# Patient Record
Sex: Female | Born: 1935 | Race: White | Hispanic: No | Smoking: Former smoker
Health system: Southern US, Community
[De-identification: ages and names within clinical notes are randomized; demographics above are authoritative.]

## PROBLEM LIST (undated history)

## (undated) DIAGNOSIS — M8580 Other specified disorders of bone density and structure, unspecified site: Secondary | ICD-10-CM

## (undated) DIAGNOSIS — Z972 Presence of dental prosthetic device (complete) (partial): Secondary | ICD-10-CM

## (undated) DIAGNOSIS — E278 Other specified disorders of adrenal gland: Secondary | ICD-10-CM

## (undated) DIAGNOSIS — M1990 Unspecified osteoarthritis, unspecified site: Secondary | ICD-10-CM

## (undated) DIAGNOSIS — R42 Dizziness and giddiness: Secondary | ICD-10-CM

## (undated) DIAGNOSIS — I10 Essential (primary) hypertension: Secondary | ICD-10-CM

## (undated) DIAGNOSIS — G5600 Carpal tunnel syndrome, unspecified upper limb: Secondary | ICD-10-CM

## (undated) DIAGNOSIS — E785 Hyperlipidemia, unspecified: Secondary | ICD-10-CM

## (undated) DIAGNOSIS — J449 Chronic obstructive pulmonary disease, unspecified: Secondary | ICD-10-CM

## (undated) DIAGNOSIS — I82409 Acute embolism and thrombosis of unspecified deep veins of unspecified lower extremity: Secondary | ICD-10-CM

## (undated) DIAGNOSIS — E669 Obesity, unspecified: Secondary | ICD-10-CM

## (undated) DIAGNOSIS — T148XXA Other injury of unspecified body region, initial encounter: Secondary | ICD-10-CM

## (undated) DIAGNOSIS — I809 Phlebitis and thrombophlebitis of unspecified site: Secondary | ICD-10-CM

## (undated) DIAGNOSIS — E559 Vitamin D deficiency, unspecified: Secondary | ICD-10-CM

## (undated) DIAGNOSIS — K219 Gastro-esophageal reflux disease without esophagitis: Secondary | ICD-10-CM

## (undated) HISTORY — PX: TUBAL LIGATION: SHX77

## (undated) HISTORY — PX: APPENDECTOMY: SHX54

## (undated) HISTORY — PX: FOOT SURGERY: SHX648

## (undated) HISTORY — PX: OTHER SURGICAL HISTORY: SHX169

## (undated) HISTORY — PX: CHOLECYSTECTOMY: SHX55

---

## 1973-06-21 DIAGNOSIS — I82409 Acute embolism and thrombosis of unspecified deep veins of unspecified lower extremity: Secondary | ICD-10-CM

## 1973-06-21 HISTORY — DX: Acute embolism and thrombosis of unspecified deep veins of unspecified lower extremity: I82.409

## 2005-01-06 ENCOUNTER — Ambulatory Visit: Payer: Self-pay | Admitting: Podiatry

## 2005-01-26 ENCOUNTER — Ambulatory Visit: Payer: Self-pay | Admitting: Internal Medicine

## 2005-10-14 ENCOUNTER — Ambulatory Visit: Payer: Self-pay | Admitting: Internal Medicine

## 2005-11-02 ENCOUNTER — Ambulatory Visit: Payer: Self-pay | Admitting: General Surgery

## 2005-11-17 ENCOUNTER — Ambulatory Visit: Payer: Self-pay | Admitting: General Surgery

## 2006-02-03 ENCOUNTER — Ambulatory Visit: Payer: Self-pay | Admitting: Unknown Physician Specialty

## 2007-04-04 ENCOUNTER — Ambulatory Visit: Payer: Self-pay | Admitting: Unknown Physician Specialty

## 2008-05-24 ENCOUNTER — Ambulatory Visit: Payer: Self-pay | Admitting: General Surgery

## 2008-06-03 ENCOUNTER — Ambulatory Visit: Payer: Self-pay | Admitting: General Surgery

## 2008-06-11 ENCOUNTER — Ambulatory Visit: Payer: Self-pay | Admitting: Unknown Physician Specialty

## 2009-01-03 ENCOUNTER — Ambulatory Visit: Payer: Self-pay | Admitting: Internal Medicine

## 2009-06-18 ENCOUNTER — Ambulatory Visit: Payer: Self-pay | Admitting: Unknown Physician Specialty

## 2010-02-19 ENCOUNTER — Ambulatory Visit: Payer: Self-pay | Admitting: Internal Medicine

## 2010-05-18 ENCOUNTER — Ambulatory Visit: Payer: Self-pay | Admitting: Gastroenterology

## 2010-06-19 ENCOUNTER — Ambulatory Visit: Payer: Self-pay | Admitting: Unknown Physician Specialty

## 2011-03-09 ENCOUNTER — Ambulatory Visit: Payer: Self-pay | Admitting: Internal Medicine

## 2011-03-15 ENCOUNTER — Ambulatory Visit: Payer: Self-pay | Admitting: Otolaryngology

## 2011-07-15 ENCOUNTER — Ambulatory Visit: Payer: Self-pay | Admitting: Unknown Physician Specialty

## 2012-03-13 ENCOUNTER — Ambulatory Visit: Payer: Self-pay | Admitting: Internal Medicine

## 2013-03-21 ENCOUNTER — Ambulatory Visit: Payer: Self-pay | Admitting: Internal Medicine

## 2013-07-26 ENCOUNTER — Ambulatory Visit: Payer: Self-pay | Admitting: Internal Medicine

## 2013-12-24 ENCOUNTER — Ambulatory Visit: Payer: Self-pay

## 2013-12-27 ENCOUNTER — Ambulatory Visit: Payer: Self-pay

## 2014-02-19 ENCOUNTER — Ambulatory Visit: Payer: Self-pay | Admitting: Internal Medicine

## 2014-04-21 HISTORY — PX: ROTATOR CUFF REPAIR: SHX139

## 2014-08-08 ENCOUNTER — Ambulatory Visit: Payer: Self-pay | Admitting: Internal Medicine

## 2015-07-04 ENCOUNTER — Encounter: Payer: Self-pay | Admitting: *Deleted

## 2015-07-07 ENCOUNTER — Encounter: Payer: Self-pay | Admitting: *Deleted

## 2015-07-07 ENCOUNTER — Encounter
Admission: RE | Disposition: A | Discharge status: Home / Self Care | Payer: Self-pay | Source: Ambulatory Visit | Attending: Gastroenterology

## 2015-07-07 ENCOUNTER — Ambulatory Visit
Admission: RE | Admit: 2015-07-07 | Discharge: 2015-07-07 | Disposition: A | Discharge status: Home / Self Care | Payer: PPO | Source: Ambulatory Visit | Attending: Gastroenterology | Admitting: Gastroenterology

## 2015-07-07 DIAGNOSIS — Z1211 Encounter for screening for malignant neoplasm of colon: Secondary | ICD-10-CM | POA: Diagnosis not present

## 2015-07-07 DIAGNOSIS — Z86010 Personal history of colonic polyps: Secondary | ICD-10-CM | POA: Insufficient documentation

## 2015-07-07 DIAGNOSIS — K5730 Diverticulosis of large intestine without perforation or abscess without bleeding: Secondary | ICD-10-CM | POA: Insufficient documentation

## 2015-07-07 HISTORY — DX: Vitamin D deficiency, unspecified: E55.9

## 2015-07-07 HISTORY — DX: Gastro-esophageal reflux disease without esophagitis: K21.9

## 2015-07-07 HISTORY — DX: Carpal tunnel syndrome, unspecified upper limb: G56.00

## 2015-07-07 HISTORY — DX: Hyperlipidemia, unspecified: E78.5

## 2015-07-07 HISTORY — DX: Other specified disorders of bone density and structure, unspecified site: M85.80

## 2015-07-07 HISTORY — DX: Chronic obstructive pulmonary disease, unspecified: J44.9

## 2015-07-07 HISTORY — DX: Phlebitis and thrombophlebitis of unspecified site: I80.9

## 2015-07-07 HISTORY — DX: Obesity, unspecified: E66.9

## 2015-07-07 HISTORY — DX: Other specified disorders of adrenal gland: E27.8

## 2015-07-07 HISTORY — DX: Acute embolism and thrombosis of unspecified deep veins of unspecified lower extremity: I82.409

## 2015-07-07 SURGERY — COLONOSCOPY WITH PROPOFOL
Anesthesia: General

## 2015-07-07 MED ORDER — FENTANYL CITRATE (PF) 100 MCG/2ML IJ SOLN
INTRAMUSCULAR | Status: AC
  Filled 2015-07-07: qty 4

## 2015-07-07 MED ORDER — SODIUM CHLORIDE 0.9 % IV SOLN
INTRAVENOUS | Status: DC
  Administered 2015-07-07: 14:00:00 via INTRAVENOUS

## 2015-07-07 MED ORDER — MIDAZOLAM HCL 5 MG/5ML IJ SOLN
INTRAMUSCULAR | Status: AC
  Filled 2015-07-07: qty 10

## 2015-07-08 ENCOUNTER — Ambulatory Visit: Payer: PPO | Admitting: Anesthesiology

## 2015-07-08 ENCOUNTER — Encounter
Admission: RE | Disposition: A | Discharge status: Home / Self Care | Payer: Self-pay | Source: Ambulatory Visit | Attending: Gastroenterology

## 2015-07-08 ENCOUNTER — Ambulatory Visit: Admission: RE | Admit: 2015-07-08 | Payer: PPO | Source: Ambulatory Visit | Admitting: Gastroenterology

## 2015-07-08 ENCOUNTER — Encounter: Payer: Self-pay | Admitting: Anesthesiology

## 2015-07-08 ENCOUNTER — Ambulatory Visit
Admission: RE | Admit: 2015-07-08 | Discharge: 2015-07-08 | Disposition: A | Discharge status: Home / Self Care | Payer: PPO | Source: Ambulatory Visit | Attending: Gastroenterology | Admitting: Gastroenterology

## 2015-07-08 DIAGNOSIS — E559 Vitamin D deficiency, unspecified: Secondary | ICD-10-CM | POA: Diagnosis not present

## 2015-07-08 DIAGNOSIS — J449 Chronic obstructive pulmonary disease, unspecified: Secondary | ICD-10-CM | POA: Insufficient documentation

## 2015-07-08 DIAGNOSIS — Z7982 Long term (current) use of aspirin: Secondary | ICD-10-CM | POA: Diagnosis not present

## 2015-07-08 DIAGNOSIS — D126 Benign neoplasm of colon, unspecified: Secondary | ICD-10-CM | POA: Diagnosis not present

## 2015-07-08 DIAGNOSIS — Z1211 Encounter for screening for malignant neoplasm of colon: Secondary | ICD-10-CM | POA: Diagnosis not present

## 2015-07-08 DIAGNOSIS — Z79899 Other long term (current) drug therapy: Secondary | ICD-10-CM | POA: Diagnosis not present

## 2015-07-08 DIAGNOSIS — K5730 Diverticulosis of large intestine without perforation or abscess without bleeding: Secondary | ICD-10-CM | POA: Diagnosis not present

## 2015-07-08 DIAGNOSIS — K219 Gastro-esophageal reflux disease without esophagitis: Secondary | ICD-10-CM | POA: Insufficient documentation

## 2015-07-08 DIAGNOSIS — K5790 Diverticulosis of intestine, part unspecified, without perforation or abscess without bleeding: Secondary | ICD-10-CM | POA: Diagnosis not present

## 2015-07-08 DIAGNOSIS — K635 Polyp of colon: Secondary | ICD-10-CM | POA: Diagnosis not present

## 2015-07-08 DIAGNOSIS — E669 Obesity, unspecified: Secondary | ICD-10-CM | POA: Insufficient documentation

## 2015-07-08 DIAGNOSIS — E785 Hyperlipidemia, unspecified: Secondary | ICD-10-CM | POA: Diagnosis not present

## 2015-07-08 DIAGNOSIS — Z87891 Personal history of nicotine dependence: Secondary | ICD-10-CM | POA: Diagnosis not present

## 2015-07-08 DIAGNOSIS — D124 Benign neoplasm of descending colon: Secondary | ICD-10-CM | POA: Diagnosis not present

## 2015-07-08 DIAGNOSIS — Z86010 Personal history of colonic polyps: Secondary | ICD-10-CM | POA: Diagnosis not present

## 2015-07-08 HISTORY — PX: COLONOSCOPY: SHX5424

## 2015-07-08 SURGERY — COLONOSCOPY
Anesthesia: General

## 2015-07-08 SURGERY — COLONOSCOPY WITH PROPOFOL
Anesthesia: General

## 2015-07-08 MED ORDER — SODIUM CHLORIDE 0.9 % IV SOLN: INTRAVENOUS | Status: DC

## 2015-07-08 MED ORDER — PROPOFOL 10 MG/ML IV BOLUS
INTRAVENOUS | Status: DC | PRN
  Administered 2015-07-08: 200 mg via INTRAVENOUS
  Administered 2015-07-08: 50 mg via INTRAVENOUS

## 2015-07-08 NOTE — Anesthesia Preprocedure Evaluation (Signed)
Anesthesia Evaluation  Patient identified by MRN, date of birth, ID band Patient awake    Reviewed: Allergy & Precautions, H&P , NPO status , Patient's Chart, lab work & pertinent test results  History of Anesthesia Complications Negative for: history of anesthetic complications  Airway Mallampati: III  TM Distance: >3 FB Neck ROM: limited    Dental no notable dental hx. (+) Poor Dentition, Missing, Upper Dentures, Lower Dentures   Pulmonary neg shortness of breath, COPD, former smoker,    Pulmonary exam normal breath sounds clear to auscultation       Cardiovascular Exercise Tolerance: Good (-) angina(-) Past MI and (-) DOE Normal cardiovascular exam Rhythm:regular Rate:Normal     Neuro/Psych  Neuromuscular disease negative psych ROS   GI/Hepatic Neg liver ROS, GERD  Controlled,  Endo/Other  negative endocrine ROS  Renal/GU negative Renal ROS  negative genitourinary   Musculoskeletal   Abdominal   Peds  Hematology negative hematology ROS (+)   Anesthesia Other Findings Past Medical History:   Carpal tunnel syndrome                                       COPD (chronic obstructive pulmonary disease) (*              GERD (gastroesophageal reflux disease)                       DVT (deep venous thrombosis) (HCC)                           Hyperlipidemia                                               Adrenal mass (HCC)                                           Obesity                                                      Osteopenia                                                   Phlebitis                                                    Vitamin D deficiency                                        Past Surgical History:   APPENDECTOMY  TUBAL LIGATION                                                CHOLECYSTECTOMY                                               bladder  tack                                                  FOOT SURGERY                                                  ROTATOR CUFF REPAIR                             Right Nov.2015       Reproductive/Obstetrics negative OB ROS                             Anesthesia Physical Anesthesia Plan  ASA: III  Anesthesia Plan: General   Post-op Pain Management:    Induction:   Airway Management Planned:   Additional Equipment:   Intra-op Plan:   Post-operative Plan:   Informed Consent: I have reviewed the patients History and Physical, chart, labs and discussed the procedure including the risks, benefits and alternatives for the proposed anesthesia with the patient or authorized representative who has indicated his/her understanding and acceptance.   Dental Advisory Given  Plan Discussed with: Anesthesiologist, CRNA and Surgeon  Anesthesia Plan Comments:         Anesthesia Quick Evaluation

## 2015-07-08 NOTE — H&P (Signed)
Outpatient short stay form Pre-procedure 07/08/2015 7:29 AM Lollie Sails MD  Primary Physician: Dr. Lottie Mussel  Reason for visit:  Colonoscopy  History of present illness:  Patient is a 81 year old female presenting today for colonoscopy. Her last colonoscopy was November 2011. She has a personal history of adenomatous colon polyps. She tolerated her prep well. She had actually been prepped for procedure yesterday however unable to do that and was rescheduled to today. She takes a 81 mg aspirin but has held that. She denies any other aspirin products or blood thinning agents.    Current facility-administered medications:  .  0.9 %  sodium chloride infusion, , Intravenous, Continuous, Lollie Sails, MD .  0.9 %  sodium chloride infusion, , Intravenous, Continuous, Lollie Sails, MD  Prescriptions prior to admission  Medication Sig Dispense Refill Last Dose  . aspirin 81 MG tablet Take 81 mg by mouth daily.   Past Week at Unknown time  . B COMPLEX-C-FOLIC ACID ER PO Take by mouth daily.   Past Week at Unknown time  . Biotin 1 MG CAPS Take 1,000 mcg by mouth daily.   Past Week at Unknown time  . Calcium Carbonate-Vitamin D 300-100 MG-UNIT CAPS Take 1,500 mg by mouth 2 (two) times daily.   Past Week at Unknown time  . cholecalciferol (VITAMIN D) 1000 units tablet Take 1,000 Units by mouth daily.   Past Week at Unknown time  . co-enzyme Q-10 50 MG capsule Take 100 mg by mouth daily.   Past Week at Unknown time  . glucosamine-chondroitin 500-400 MG tablet Take 1 tablet by mouth daily.   Past Week at Unknown time  . HYDROcodone-acetaminophen (NORCO/VICODIN) 5-325 MG tablet Take 1 tablet by mouth every 6 (six) hours as needed for moderate pain.   Past Week at Unknown time  . meclizine (ANTIVERT) 12.5 MG tablet Take 12.5 mg by mouth 3 (three) times daily as needed for dizziness.   Past Week at Unknown time  . omeprazole (PRILOSEC) 40 MG capsule Take 40 mg by mouth daily.   07/06/2015  at Unknown time  . simvastatin (ZOCOR) 40 MG tablet Take 40 mg by mouth daily.   Past Week at Unknown time  . vitamin B-12 (CYANOCOBALAMIN) 1000 MCG tablet Take 1,000 mcg by mouth daily.   Past Week at Unknown time  . vitamin E 400 UNIT capsule Take 400 Units by mouth daily.   Past Week at Unknown time     Allergies  Allergen Reactions  . Azithromycin Other (See Comments)     Past Medical History  Diagnosis Date  . Carpal tunnel syndrome   . COPD (chronic obstructive pulmonary disease) ([place])   . GERD (gastroesophageal reflux disease)   . DVT (deep venous thrombosis) ([place])   . Hyperlipidemia   . Adrenal mass ([place])   . Obesity   . Osteopenia   . Phlebitis   . Vitamin D deficiency     Review of systems:      Physical Exam    Heart and lungs: Regular rate and rhythm without rub or gallop, lungs are bilaterally clear    HEENT: Normocephalic atraumatic eyes are anicteric    Other:     Pertinant exam for procedure: Off nontender nondistended bowel sounds positive normoactive    Planned proceedures: Colonoscopy and indicated procedures. I have discussed the risks benefits and complications of procedures to include not limited to bleeding, infection, perforation and the risk of sedation and the patient wishes  to proceed.    Lollie Sails, MD Gastroenterology 07/08/2015  7:29 AM

## 2015-07-08 NOTE — Op Note (Signed)
[facility] Gastroenterology Patient Name: Stacy Hoffman Procedure Date: 07/08/2015 7:10 AM MRN: [id number] Account #: [id number] Date of Birth: December 31, 1934 Admit Type: Outpatient Age: 81 Room: [facility] ENDO ROOM 3 Gender: Female Note Status: Finalized Procedure:         Colonoscopy Indications:       Personal history of colonic polyps Providers:         Lollie Sails, MD Referring MD:      Hewitt Blade. Sarina Ser, MD (Referring MD) Medicines:         Monitored Anesthesia Care Complications:     No immediate complications. Procedure:         Pre-Anesthesia Assessment:                    - ASA Grade Assessment: III - A patient with severe                     systemic disease.                    After obtaining informed consent, the colonoscope was                     passed under direct vision. Throughout the procedure, the                     patient's blood pressure, pulse, and oxygen saturations                     were monitored continuously. The Colonoscope was                     introduced through the anus and advanced to the the cecum,                     identified by appendiceal orifice and ileocecal valve. The                     colonoscopy was performed with moderate difficulty due to                     poor bowel prep. Successful completion of the procedure                     was aided by using manual pressure and lavage. The patient                     tolerated the procedure well. The quality of the bowel                     preparation was good except the ascending colon was fair.                     The patient tolerated the procedure well. Findings:      Multiple small and large-mouthed diverticula were found in the sigmoid       colon, in the descending colon and at the splenic flexure.      A 4 mm polyp was found at 34 cm proximal to the anus. The polyp was       sessile. The polyp was removed with a cold biopsy forceps. Resection and   retrieval were complete.      The digital rectal exam was normal. Impression:        -  Diverticulosis in the sigmoid colon, in the descending                     colon and at the splenic flexure.                    - One 4 mm polyp at 34 cm proximal to the anus. Resected                     and retrieved. Recommendation:    - Await pathology results.                    - Telephone GI clinic for pathology results in 1 week. Procedure Code(s): --- Professional ---                    [phone], Colonoscopy, flexible; with biopsy, single or                     multiple Diagnosis Code(s): --- Professional ---                    D12.6, Benign neoplasm of colon, unspecified                    Z86.010, Personal history of colonic polyps                    K57.30, Diverticulosis of large intestine without                     perforation or abscess without bleeding CPT copyright 2014 American [employer]. All rights reserved. The codes documented in this report are preliminary and upon coder review may  be revised to meet current compliance requirements. Lollie Sails, MD 07/08/2015 8:27:24 AM This report has been signed electronically. Number of Addenda: 0 Note Initiated On: 07/08/2015 7:10 AM Scope Withdrawal Time: 0 hours 16 minutes 46 seconds  Total Procedure Duration: 0 hours 44 minutes 13 seconds       [facility]

## 2015-07-08 NOTE — Transfer of Care (Signed)
Immediate Anesthesia Transfer of Care Note  Patient: Stacy Hoffman  Procedure(s) Performed: Procedure(s): COLONOSCOPY (N/A)  Patient Location: PACU  Anesthesia Type:General  Level of Consciousness: awake, alert  and oriented  Airway & Oxygen Therapy: Patient Spontanous Breathing and Patient connected to nasal cannula oxygen  Post-op Assessment: Report given to RN and Post -op Vital signs reviewed and stable  Post vital signs: Reviewed and stable  Last Vitals:  Filed Vitals:   07/08/15 0702 07/08/15 0826  BP: 135/76 123/69  Pulse: 76 62  Temp: 36.3 C 35.6 C  Resp: 17 16    Complications: No apparent anesthesia complications

## 2015-07-08 NOTE — Anesthesia Postprocedure Evaluation (Signed)
Anesthesia Post Note  Patient: Stacy Hoffman  Procedure(s) Performed: Procedure(s) (LRB): COLONOSCOPY (N/A)  Patient location during evaluation: Endoscopy Anesthesia Type: General Level of consciousness: awake and alert Pain management: pain level controlled Vital Signs Assessment: post-procedure vital signs reviewed and stable Respiratory status: spontaneous breathing, nonlabored ventilation, respiratory function stable and patient connected to nasal cannula oxygen Cardiovascular status: blood pressure returned to baseline and stable Postop Assessment: no signs of nausea or vomiting Anesthetic complications: no    Last Vitals:  Filed Vitals:   07/08/15 0846 07/08/15 0856  BP: 150/69 157/75  Pulse: 63 56  Temp:    Resp:  16    Last Pain: There were no vitals filed for this visit.               Precious Haws Piscitello

## 2015-07-09 ENCOUNTER — Encounter: Payer: Self-pay | Admitting: Gastroenterology

## 2015-07-09 LAB — SURGICAL PATHOLOGY

## 2015-07-25 DIAGNOSIS — L538 Other specified erythematous conditions: Secondary | ICD-10-CM | POA: Diagnosis not present

## 2015-07-25 DIAGNOSIS — Z1283 Encounter for screening for malignant neoplasm of skin: Secondary | ICD-10-CM | POA: Diagnosis not present

## 2015-07-25 DIAGNOSIS — L821 Other seborrheic keratosis: Secondary | ICD-10-CM | POA: Diagnosis not present

## 2015-07-25 DIAGNOSIS — L820 Inflamed seborrheic keratosis: Secondary | ICD-10-CM | POA: Diagnosis not present

## 2015-07-25 DIAGNOSIS — L578 Other skin changes due to chronic exposure to nonionizing radiation: Secondary | ICD-10-CM | POA: Diagnosis not present

## 2015-07-25 DIAGNOSIS — D225 Melanocytic nevi of trunk: Secondary | ICD-10-CM | POA: Diagnosis not present

## 2015-07-25 DIAGNOSIS — L298 Other pruritus: Secondary | ICD-10-CM | POA: Diagnosis not present

## 2015-08-13 DIAGNOSIS — M75121 Complete rotator cuff tear or rupture of right shoulder, not specified as traumatic: Secondary | ICD-10-CM | POA: Diagnosis not present

## 2015-09-16 DIAGNOSIS — J449 Chronic obstructive pulmonary disease, unspecified: Secondary | ICD-10-CM | POA: Diagnosis not present

## 2015-09-16 DIAGNOSIS — M67911 Unspecified disorder of synovium and tendon, right shoulder: Secondary | ICD-10-CM | POA: Diagnosis not present

## 2015-09-16 DIAGNOSIS — E7800 Pure hypercholesterolemia, unspecified: Secondary | ICD-10-CM | POA: Diagnosis not present

## 2015-11-06 ENCOUNTER — Other Ambulatory Visit: Payer: Self-pay | Admitting: Internal Medicine

## 2015-11-06 DIAGNOSIS — Z1231 Encounter for screening mammogram for malignant neoplasm of breast: Secondary | ICD-10-CM

## 2015-11-13 DIAGNOSIS — M7542 Impingement syndrome of left shoulder: Secondary | ICD-10-CM | POA: Diagnosis not present

## 2015-11-20 ENCOUNTER — Ambulatory Visit
Admission: RE | Admit: 2015-11-20 | Discharge: 2015-11-20 | Disposition: A | Discharge status: Home / Self Care | Payer: PPO | Source: Ambulatory Visit | Attending: Internal Medicine | Admitting: Internal Medicine

## 2015-11-20 ENCOUNTER — Other Ambulatory Visit: Payer: Self-pay | Admitting: Internal Medicine

## 2015-11-20 DIAGNOSIS — Z1231 Encounter for screening mammogram for malignant neoplasm of breast: Secondary | ICD-10-CM | POA: Insufficient documentation

## 2015-12-30 DIAGNOSIS — H2513 Age-related nuclear cataract, bilateral: Secondary | ICD-10-CM | POA: Diagnosis not present

## 2016-01-23 DIAGNOSIS — M19111 Post-traumatic osteoarthritis, right shoulder: Secondary | ICD-10-CM | POA: Diagnosis not present

## 2016-03-12 DIAGNOSIS — E7800 Pure hypercholesterolemia, unspecified: Secondary | ICD-10-CM | POA: Diagnosis not present

## 2016-03-19 DIAGNOSIS — E279 Disorder of adrenal gland, unspecified: Secondary | ICD-10-CM | POA: Diagnosis not present

## 2016-03-19 DIAGNOSIS — Z1231 Encounter for screening mammogram for malignant neoplasm of breast: Secondary | ICD-10-CM | POA: Diagnosis not present

## 2016-03-19 DIAGNOSIS — Z23 Encounter for immunization: Secondary | ICD-10-CM | POA: Diagnosis not present

## 2016-03-19 DIAGNOSIS — E7800 Pure hypercholesterolemia, unspecified: Secondary | ICD-10-CM | POA: Diagnosis not present

## 2016-03-19 DIAGNOSIS — K219 Gastro-esophageal reflux disease without esophagitis: Secondary | ICD-10-CM | POA: Diagnosis not present

## 2016-03-19 DIAGNOSIS — Z0000 Encounter for general adult medical examination without abnormal findings: Secondary | ICD-10-CM | POA: Diagnosis not present

## 2016-03-19 DIAGNOSIS — Z0001 Encounter for general adult medical examination with abnormal findings: Secondary | ICD-10-CM | POA: Diagnosis not present

## 2016-03-22 DIAGNOSIS — M25511 Pain in right shoulder: Secondary | ICD-10-CM | POA: Diagnosis not present

## 2016-03-22 DIAGNOSIS — M75121 Complete rotator cuff tear or rupture of right shoulder, not specified as traumatic: Secondary | ICD-10-CM | POA: Diagnosis not present

## 2016-04-15 DIAGNOSIS — G894 Chronic pain syndrome: Secondary | ICD-10-CM | POA: Diagnosis not present

## 2016-04-15 DIAGNOSIS — Z79891 Long term (current) use of opiate analgesic: Secondary | ICD-10-CM | POA: Diagnosis not present

## 2016-04-15 DIAGNOSIS — M75121 Complete rotator cuff tear or rupture of right shoulder, not specified as traumatic: Secondary | ICD-10-CM | POA: Diagnosis not present

## 2016-05-19 DIAGNOSIS — L988 Other specified disorders of the skin and subcutaneous tissue: Secondary | ICD-10-CM | POA: Diagnosis not present

## 2016-05-27 DIAGNOSIS — M25512 Pain in left shoulder: Secondary | ICD-10-CM | POA: Diagnosis not present

## 2016-05-27 DIAGNOSIS — M25511 Pain in right shoulder: Secondary | ICD-10-CM | POA: Diagnosis not present

## 2016-06-30 DIAGNOSIS — M75121 Complete rotator cuff tear or rupture of right shoulder, not specified as traumatic: Secondary | ICD-10-CM | POA: Diagnosis not present

## 2016-06-30 DIAGNOSIS — M19011 Primary osteoarthritis, right shoulder: Secondary | ICD-10-CM | POA: Diagnosis not present

## 2016-06-30 DIAGNOSIS — Z01818 Encounter for other preprocedural examination: Secondary | ICD-10-CM | POA: Diagnosis not present

## 2016-07-02 DIAGNOSIS — E7800 Pure hypercholesterolemia, unspecified: Secondary | ICD-10-CM | POA: Diagnosis not present

## 2016-07-02 DIAGNOSIS — J449 Chronic obstructive pulmonary disease, unspecified: Secondary | ICD-10-CM | POA: Diagnosis not present

## 2016-07-02 DIAGNOSIS — M7521 Bicipital tendinitis, right shoulder: Secondary | ICD-10-CM | POA: Diagnosis not present

## 2016-07-02 DIAGNOSIS — G8918 Other acute postprocedural pain: Secondary | ICD-10-CM | POA: Diagnosis not present

## 2016-07-02 DIAGNOSIS — Z86718 Personal history of other venous thrombosis and embolism: Secondary | ICD-10-CM | POA: Diagnosis not present

## 2016-07-02 DIAGNOSIS — M19011 Primary osteoarthritis, right shoulder: Secondary | ICD-10-CM | POA: Diagnosis not present

## 2016-07-02 DIAGNOSIS — Z7901 Long term (current) use of anticoagulants: Secondary | ICD-10-CM | POA: Diagnosis not present

## 2016-07-02 DIAGNOSIS — E039 Hypothyroidism, unspecified: Secondary | ICD-10-CM | POA: Diagnosis not present

## 2016-07-02 DIAGNOSIS — M159 Polyosteoarthritis, unspecified: Secondary | ICD-10-CM | POA: Diagnosis not present

## 2016-07-02 DIAGNOSIS — E279 Disorder of adrenal gland, unspecified: Secondary | ICD-10-CM | POA: Diagnosis not present

## 2016-07-02 DIAGNOSIS — M75121 Complete rotator cuff tear or rupture of right shoulder, not specified as traumatic: Secondary | ICD-10-CM | POA: Diagnosis not present

## 2016-07-02 DIAGNOSIS — K219 Gastro-esophageal reflux disease without esophagitis: Secondary | ICD-10-CM | POA: Diagnosis not present

## 2016-07-02 DIAGNOSIS — Z87891 Personal history of nicotine dependence: Secondary | ICD-10-CM | POA: Diagnosis not present

## 2016-07-02 DIAGNOSIS — M25511 Pain in right shoulder: Secondary | ICD-10-CM | POA: Diagnosis not present

## 2016-07-02 DIAGNOSIS — E785 Hyperlipidemia, unspecified: Secondary | ICD-10-CM | POA: Diagnosis not present

## 2016-07-02 DIAGNOSIS — Z7982 Long term (current) use of aspirin: Secondary | ICD-10-CM | POA: Diagnosis not present

## 2016-07-02 DIAGNOSIS — E559 Vitamin D deficiency, unspecified: Secondary | ICD-10-CM | POA: Diagnosis not present

## 2016-07-02 DIAGNOSIS — Z4789 Encounter for other orthopedic aftercare: Secondary | ICD-10-CM | POA: Diagnosis not present

## 2016-07-02 DIAGNOSIS — Z79891 Long term (current) use of opiate analgesic: Secondary | ICD-10-CM | POA: Diagnosis not present

## 2016-07-02 HISTORY — PX: TOTAL SHOULDER REPLACEMENT: SUR1217

## 2016-07-12 DIAGNOSIS — M25511 Pain in right shoulder: Secondary | ICD-10-CM | POA: Diagnosis not present

## 2016-07-15 DIAGNOSIS — M25512 Pain in left shoulder: Secondary | ICD-10-CM | POA: Diagnosis not present

## 2016-07-15 DIAGNOSIS — M25511 Pain in right shoulder: Secondary | ICD-10-CM | POA: Diagnosis not present

## 2016-07-19 DIAGNOSIS — M25511 Pain in right shoulder: Secondary | ICD-10-CM | POA: Diagnosis not present

## 2016-07-19 DIAGNOSIS — M25611 Stiffness of right shoulder, not elsewhere classified: Secondary | ICD-10-CM | POA: Diagnosis not present

## 2016-07-22 DIAGNOSIS — M25511 Pain in right shoulder: Secondary | ICD-10-CM | POA: Diagnosis not present

## 2016-07-22 DIAGNOSIS — M25611 Stiffness of right shoulder, not elsewhere classified: Secondary | ICD-10-CM | POA: Diagnosis not present

## 2016-07-27 DIAGNOSIS — M25611 Stiffness of right shoulder, not elsewhere classified: Secondary | ICD-10-CM | POA: Diagnosis not present

## 2016-07-27 DIAGNOSIS — M25511 Pain in right shoulder: Secondary | ICD-10-CM | POA: Diagnosis not present

## 2016-07-29 DIAGNOSIS — M25511 Pain in right shoulder: Secondary | ICD-10-CM | POA: Diagnosis not present

## 2016-07-29 DIAGNOSIS — M25611 Stiffness of right shoulder, not elsewhere classified: Secondary | ICD-10-CM | POA: Diagnosis not present

## 2016-08-05 DIAGNOSIS — M25511 Pain in right shoulder: Secondary | ICD-10-CM | POA: Diagnosis not present

## 2016-08-05 DIAGNOSIS — M25611 Stiffness of right shoulder, not elsewhere classified: Secondary | ICD-10-CM | POA: Diagnosis not present

## 2016-08-10 DIAGNOSIS — M25611 Stiffness of right shoulder, not elsewhere classified: Secondary | ICD-10-CM | POA: Diagnosis not present

## 2016-08-10 DIAGNOSIS — M25511 Pain in right shoulder: Secondary | ICD-10-CM | POA: Diagnosis not present

## 2016-08-12 DIAGNOSIS — M25511 Pain in right shoulder: Secondary | ICD-10-CM | POA: Diagnosis not present

## 2016-08-12 DIAGNOSIS — M25611 Stiffness of right shoulder, not elsewhere classified: Secondary | ICD-10-CM | POA: Diagnosis not present

## 2016-08-17 DIAGNOSIS — M25511 Pain in right shoulder: Secondary | ICD-10-CM | POA: Diagnosis not present

## 2016-08-17 DIAGNOSIS — M25611 Stiffness of right shoulder, not elsewhere classified: Secondary | ICD-10-CM | POA: Diagnosis not present

## 2016-08-19 DIAGNOSIS — M25611 Stiffness of right shoulder, not elsewhere classified: Secondary | ICD-10-CM | POA: Diagnosis not present

## 2016-08-19 DIAGNOSIS — M25511 Pain in right shoulder: Secondary | ICD-10-CM | POA: Diagnosis not present

## 2016-08-23 DIAGNOSIS — M25511 Pain in right shoulder: Secondary | ICD-10-CM | POA: Diagnosis not present

## 2016-08-24 DIAGNOSIS — M25511 Pain in right shoulder: Secondary | ICD-10-CM | POA: Diagnosis not present

## 2016-08-24 DIAGNOSIS — M25611 Stiffness of right shoulder, not elsewhere classified: Secondary | ICD-10-CM | POA: Diagnosis not present

## 2016-08-26 DIAGNOSIS — M6281 Muscle weakness (generalized): Secondary | ICD-10-CM | POA: Diagnosis not present

## 2016-09-02 DIAGNOSIS — M6281 Muscle weakness (generalized): Secondary | ICD-10-CM | POA: Diagnosis not present

## 2016-09-09 DIAGNOSIS — M25611 Stiffness of right shoulder, not elsewhere classified: Secondary | ICD-10-CM | POA: Diagnosis not present

## 2016-09-09 DIAGNOSIS — M25511 Pain in right shoulder: Secondary | ICD-10-CM | POA: Diagnosis not present

## 2016-09-13 DIAGNOSIS — M25511 Pain in right shoulder: Secondary | ICD-10-CM | POA: Diagnosis not present

## 2016-09-13 DIAGNOSIS — M25611 Stiffness of right shoulder, not elsewhere classified: Secondary | ICD-10-CM | POA: Diagnosis not present

## 2016-09-16 DIAGNOSIS — M25511 Pain in right shoulder: Secondary | ICD-10-CM | POA: Diagnosis not present

## 2016-09-20 DIAGNOSIS — M25611 Stiffness of right shoulder, not elsewhere classified: Secondary | ICD-10-CM | POA: Diagnosis not present

## 2016-09-20 DIAGNOSIS — M25511 Pain in right shoulder: Secondary | ICD-10-CM | POA: Diagnosis not present

## 2016-09-22 DIAGNOSIS — E7800 Pure hypercholesterolemia, unspecified: Secondary | ICD-10-CM | POA: Diagnosis not present

## 2016-09-22 DIAGNOSIS — K219 Gastro-esophageal reflux disease without esophagitis: Secondary | ICD-10-CM | POA: Diagnosis not present

## 2016-09-22 DIAGNOSIS — M67911 Unspecified disorder of synovium and tendon, right shoulder: Secondary | ICD-10-CM | POA: Diagnosis not present

## 2016-09-23 DIAGNOSIS — M25611 Stiffness of right shoulder, not elsewhere classified: Secondary | ICD-10-CM | POA: Diagnosis not present

## 2016-09-23 DIAGNOSIS — M25511 Pain in right shoulder: Secondary | ICD-10-CM | POA: Diagnosis not present

## 2016-09-27 DIAGNOSIS — M25611 Stiffness of right shoulder, not elsewhere classified: Secondary | ICD-10-CM | POA: Diagnosis not present

## 2016-09-27 DIAGNOSIS — M25511 Pain in right shoulder: Secondary | ICD-10-CM | POA: Diagnosis not present

## 2016-09-29 DIAGNOSIS — M25511 Pain in right shoulder: Secondary | ICD-10-CM | POA: Diagnosis not present

## 2016-10-13 ENCOUNTER — Other Ambulatory Visit: Payer: Self-pay | Admitting: Internal Medicine

## 2016-10-13 DIAGNOSIS — Z1231 Encounter for screening mammogram for malignant neoplasm of breast: Secondary | ICD-10-CM

## 2016-10-14 DIAGNOSIS — M25511 Pain in right shoulder: Secondary | ICD-10-CM | POA: Diagnosis not present

## 2016-10-14 DIAGNOSIS — M25512 Pain in left shoulder: Secondary | ICD-10-CM | POA: Diagnosis not present

## 2016-10-20 DIAGNOSIS — M25511 Pain in right shoulder: Secondary | ICD-10-CM | POA: Diagnosis not present

## 2016-11-10 DIAGNOSIS — M25511 Pain in right shoulder: Secondary | ICD-10-CM | POA: Diagnosis not present

## 2016-11-23 ENCOUNTER — Ambulatory Visit
Admission: RE | Admit: 2016-11-23 | Discharge: 2016-11-23 | Disposition: A | Discharge status: Home / Self Care | Payer: PPO | Source: Ambulatory Visit | Attending: Internal Medicine | Admitting: Internal Medicine

## 2016-11-23 DIAGNOSIS — Z1231 Encounter for screening mammogram for malignant neoplasm of breast: Secondary | ICD-10-CM | POA: Insufficient documentation

## 2016-12-13 DIAGNOSIS — L720 Epidermal cyst: Secondary | ICD-10-CM | POA: Diagnosis not present

## 2016-12-13 DIAGNOSIS — L821 Other seborrheic keratosis: Secondary | ICD-10-CM | POA: Diagnosis not present

## 2016-12-13 DIAGNOSIS — Z872 Personal history of diseases of the skin and subcutaneous tissue: Secondary | ICD-10-CM | POA: Diagnosis not present

## 2016-12-13 DIAGNOSIS — D3610 Benign neoplasm of peripheral nerves and autonomic nervous system, unspecified: Secondary | ICD-10-CM | POA: Diagnosis not present

## 2016-12-27 DIAGNOSIS — M25511 Pain in right shoulder: Secondary | ICD-10-CM | POA: Diagnosis not present

## 2017-01-13 DIAGNOSIS — M25551 Pain in right hip: Secondary | ICD-10-CM | POA: Diagnosis not present

## 2017-01-13 DIAGNOSIS — Z79899 Other long term (current) drug therapy: Secondary | ICD-10-CM | POA: Diagnosis not present

## 2017-01-13 DIAGNOSIS — G894 Chronic pain syndrome: Secondary | ICD-10-CM | POA: Diagnosis not present

## 2017-01-13 DIAGNOSIS — M25511 Pain in right shoulder: Secondary | ICD-10-CM | POA: Diagnosis not present

## 2017-01-13 DIAGNOSIS — M62838 Other muscle spasm: Secondary | ICD-10-CM | POA: Diagnosis not present

## 2017-01-13 DIAGNOSIS — Z79891 Long term (current) use of opiate analgesic: Secondary | ICD-10-CM | POA: Diagnosis not present

## 2017-01-13 DIAGNOSIS — M25512 Pain in left shoulder: Secondary | ICD-10-CM | POA: Diagnosis not present

## 2017-04-14 DIAGNOSIS — Z79891 Long term (current) use of opiate analgesic: Secondary | ICD-10-CM | POA: Diagnosis not present

## 2017-04-14 DIAGNOSIS — M25512 Pain in left shoulder: Secondary | ICD-10-CM | POA: Diagnosis not present

## 2017-04-14 DIAGNOSIS — M25511 Pain in right shoulder: Secondary | ICD-10-CM | POA: Diagnosis not present

## 2017-04-14 DIAGNOSIS — M62838 Other muscle spasm: Secondary | ICD-10-CM | POA: Diagnosis not present

## 2017-04-14 DIAGNOSIS — Z79899 Other long term (current) drug therapy: Secondary | ICD-10-CM | POA: Diagnosis not present

## 2017-04-19 DIAGNOSIS — E7800 Pure hypercholesterolemia, unspecified: Secondary | ICD-10-CM | POA: Diagnosis not present

## 2017-04-19 DIAGNOSIS — K219 Gastro-esophageal reflux disease without esophagitis: Secondary | ICD-10-CM | POA: Diagnosis not present

## 2017-04-25 DIAGNOSIS — Z13820 Encounter for screening for osteoporosis: Secondary | ICD-10-CM | POA: Diagnosis not present

## 2017-04-25 DIAGNOSIS — G5602 Carpal tunnel syndrome, left upper limb: Secondary | ICD-10-CM | POA: Diagnosis not present

## 2017-04-25 DIAGNOSIS — M25511 Pain in right shoulder: Secondary | ICD-10-CM | POA: Diagnosis not present

## 2017-04-26 DIAGNOSIS — K219 Gastro-esophageal reflux disease without esophagitis: Secondary | ICD-10-CM | POA: Diagnosis not present

## 2017-04-26 DIAGNOSIS — Z0000 Encounter for general adult medical examination without abnormal findings: Secondary | ICD-10-CM | POA: Diagnosis not present

## 2017-04-26 DIAGNOSIS — E7800 Pure hypercholesterolemia, unspecified: Secondary | ICD-10-CM | POA: Diagnosis not present

## 2017-05-12 IMAGING — MG MM DIGITAL SCREENING BILAT W/ TOMO W/ CAD
8 of 12 series · 8 of 28 positions shown · non-contrast
Comparison: Previous exam(s).

CLINICAL DATA: Screening.

EXAM:
2D DIGITAL SCREENING BILATERAL MAMMOGRAM WITH CAD AND ADJUNCT TOMO

[L MLO]
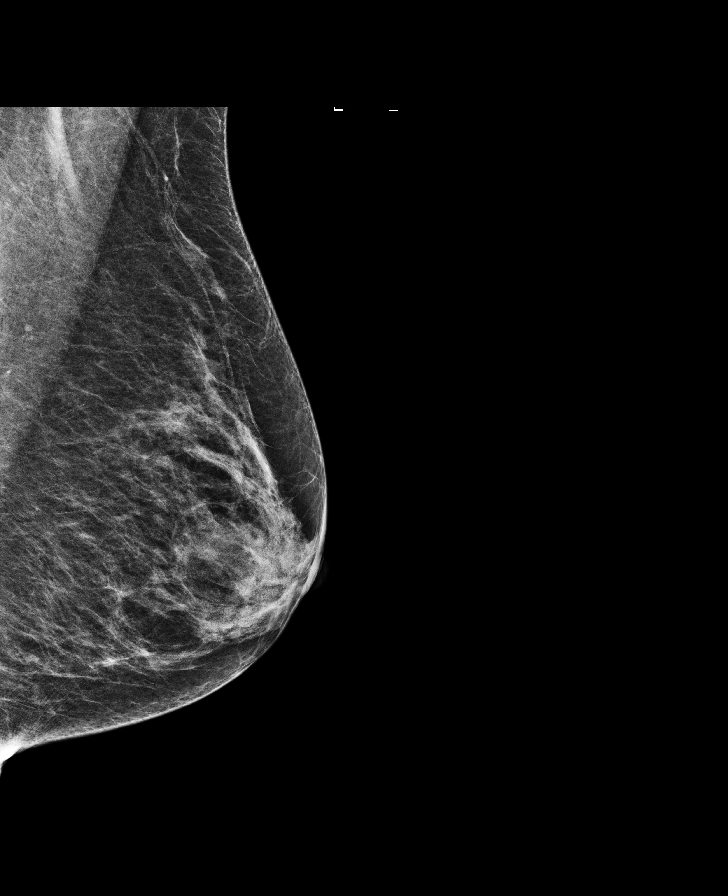

[R CC synth-2D]
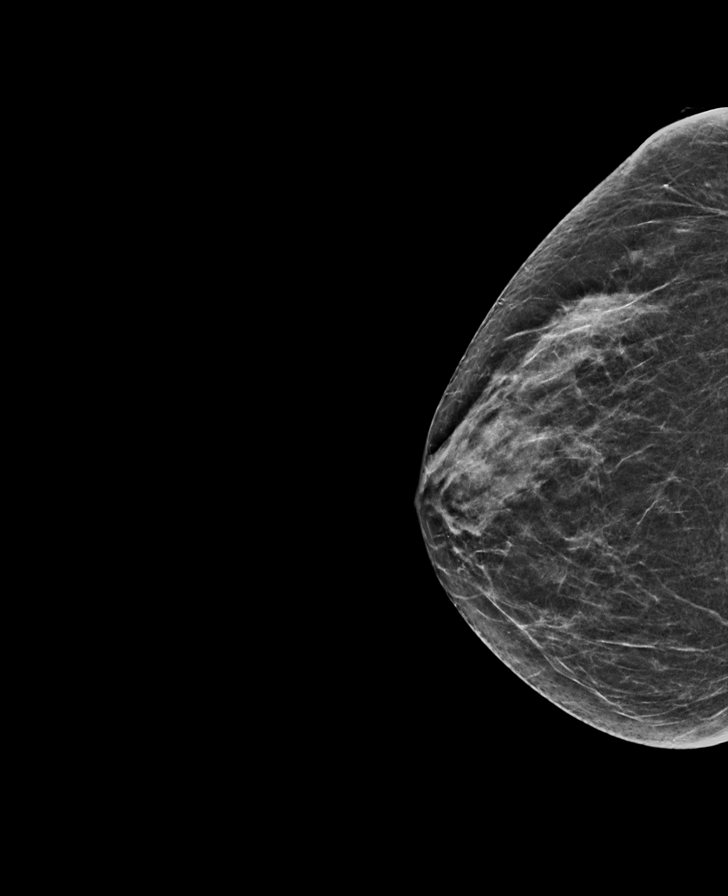

[R MLO synth-2D]
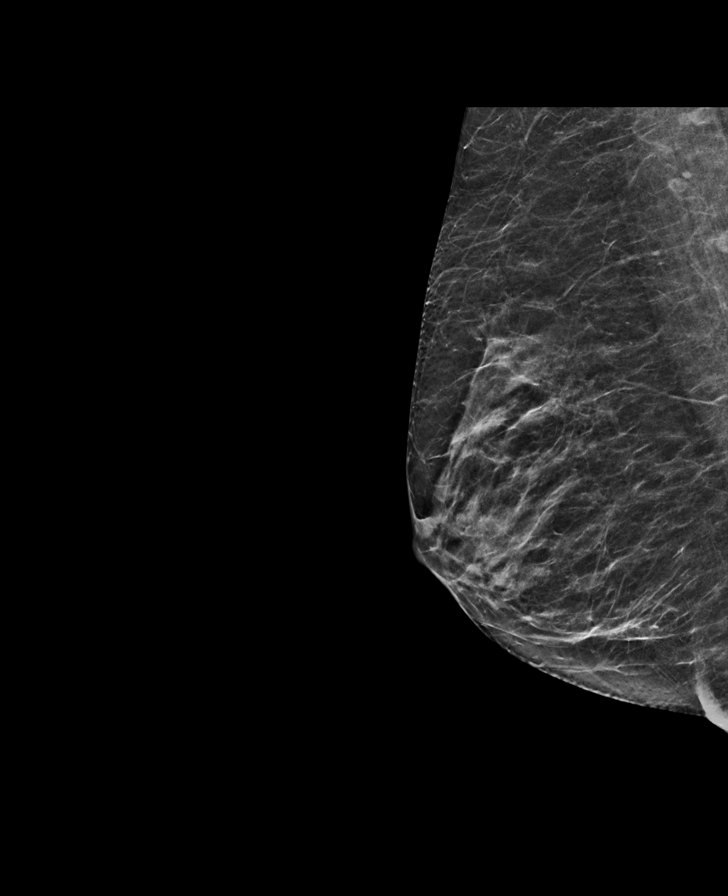

[R CC]
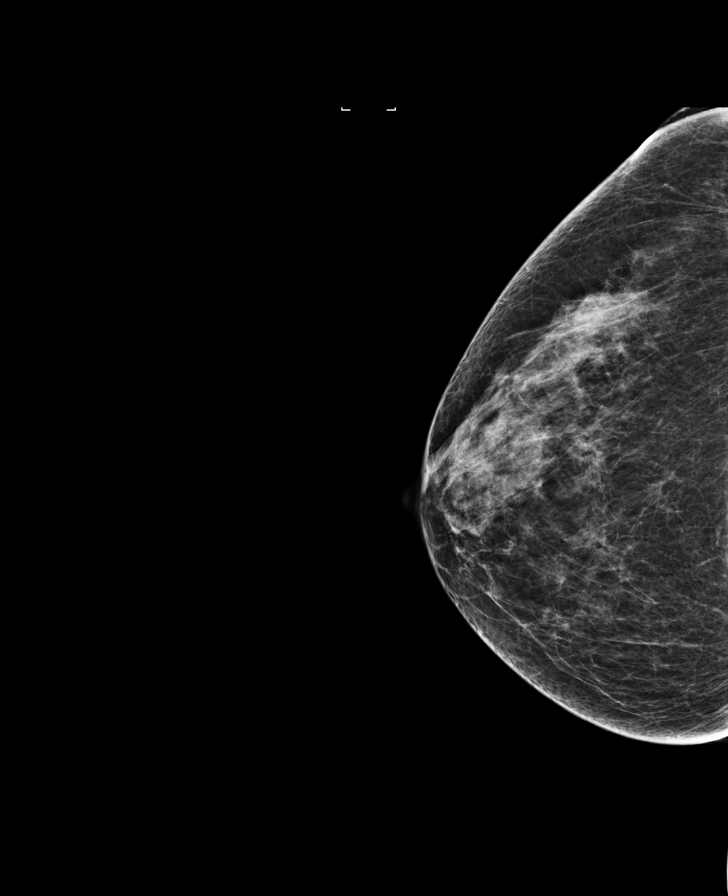

[R MLO]
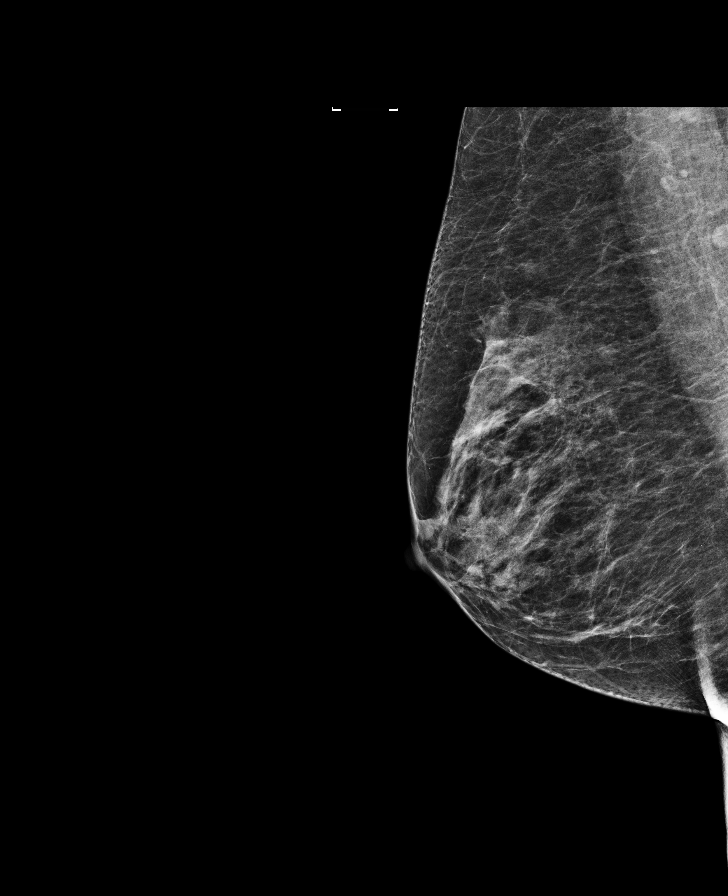

[L CC]
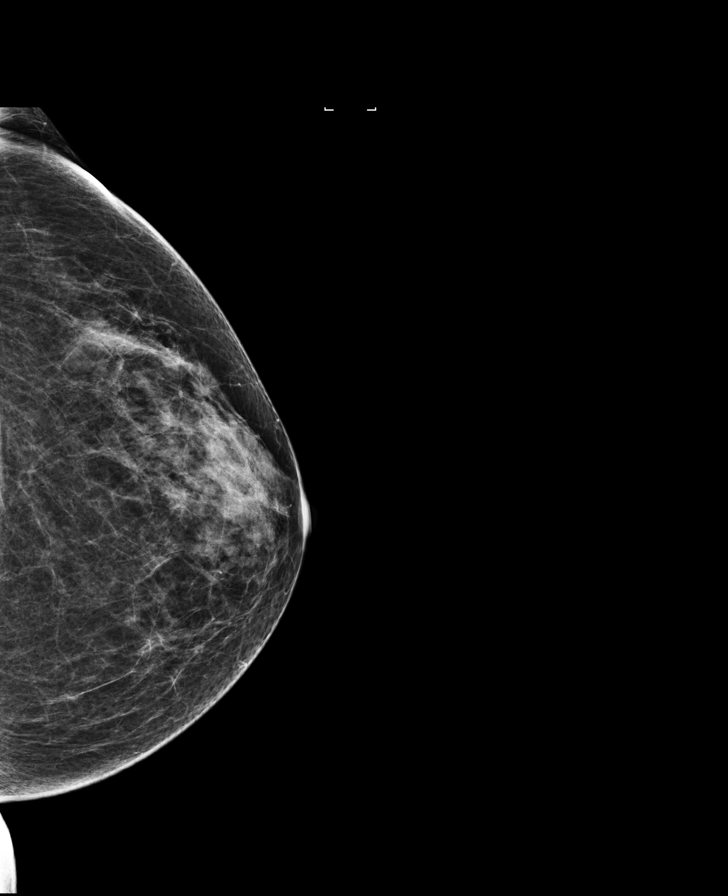

[L MLO synth-2D]
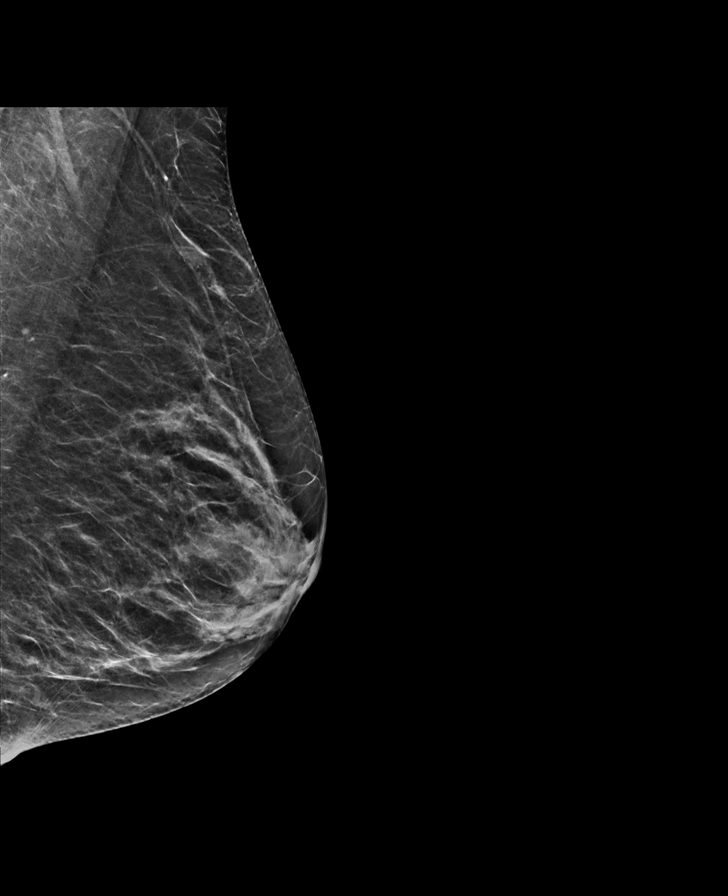

[L CC synth-2D]
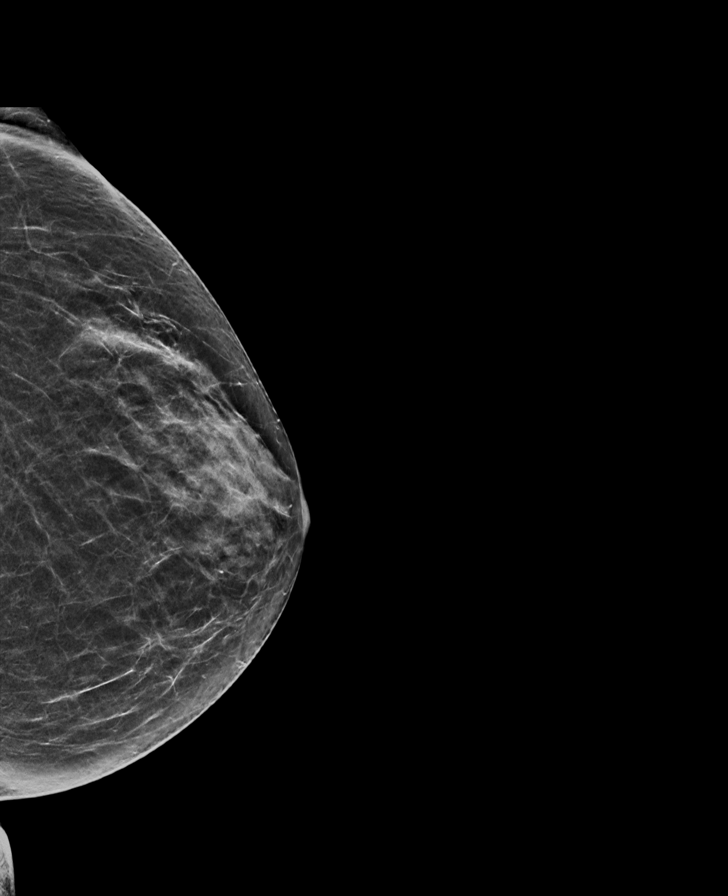

[8 of 28 positions shown; findings below may reference images not displayed]

ACR Breast Density Category c: The breast tissue is heterogeneously
dense, which may obscure small masses.
FINDINGS: There are no findings suspicious for malignancy. Images were
processed with CAD.
IMPRESSION: No mammographic evidence of malignancy. A result letter of this
screening mammogram will be mailed directly to the patient.

RECOMMENDATION:
Screening mammogram in one year. (Code:[id number]-[id number])

BI-RADS CATEGORY  1: Negative.

## 2017-05-17 DIAGNOSIS — G5602 Carpal tunnel syndrome, left upper limb: Secondary | ICD-10-CM | POA: Diagnosis not present

## 2017-06-10 DIAGNOSIS — M8589 Other specified disorders of bone density and structure, multiple sites: Secondary | ICD-10-CM | POA: Diagnosis not present

## 2017-07-13 DIAGNOSIS — Z79899 Other long term (current) drug therapy: Secondary | ICD-10-CM | POA: Diagnosis not present

## 2017-07-13 DIAGNOSIS — M25512 Pain in left shoulder: Secondary | ICD-10-CM | POA: Diagnosis not present

## 2017-07-13 DIAGNOSIS — M62838 Other muscle spasm: Secondary | ICD-10-CM | POA: Diagnosis not present

## 2017-07-13 DIAGNOSIS — G894 Chronic pain syndrome: Secondary | ICD-10-CM | POA: Diagnosis not present

## 2017-07-13 DIAGNOSIS — M25511 Pain in right shoulder: Secondary | ICD-10-CM | POA: Diagnosis not present

## 2017-07-15 DIAGNOSIS — H2513 Age-related nuclear cataract, bilateral: Secondary | ICD-10-CM | POA: Diagnosis not present

## 2017-07-28 DIAGNOSIS — E7800 Pure hypercholesterolemia, unspecified: Secondary | ICD-10-CM | POA: Diagnosis not present

## 2017-07-28 DIAGNOSIS — R030 Elevated blood-pressure reading, without diagnosis of hypertension: Secondary | ICD-10-CM | POA: Diagnosis not present

## 2017-07-28 DIAGNOSIS — Z5181 Encounter for therapeutic drug level monitoring: Secondary | ICD-10-CM | POA: Diagnosis not present

## 2017-07-28 DIAGNOSIS — M67911 Unspecified disorder of synovium and tendon, right shoulder: Secondary | ICD-10-CM | POA: Diagnosis not present

## 2017-07-28 DIAGNOSIS — K219 Gastro-esophageal reflux disease without esophagitis: Secondary | ICD-10-CM | POA: Diagnosis not present

## 2017-09-01 DIAGNOSIS — H2511 Age-related nuclear cataract, right eye: Secondary | ICD-10-CM | POA: Diagnosis not present

## 2017-09-13 ENCOUNTER — Encounter: Payer: Self-pay | Admitting: *Deleted

## 2017-09-13 ENCOUNTER — Other Ambulatory Visit: Payer: Self-pay

## 2017-09-15 NOTE — Discharge Instructions (Signed)

## 2017-09-21 ENCOUNTER — Ambulatory Visit: Payer: PPO | Admitting: Anesthesiology

## 2017-09-21 ENCOUNTER — Encounter
Admission: RE | Disposition: A | Discharge status: Home / Self Care | Payer: Self-pay | Source: Ambulatory Visit | Attending: Ophthalmology

## 2017-09-21 ENCOUNTER — Ambulatory Visit
Admission: RE | Admit: 2017-09-21 | Discharge: 2017-09-21 | Disposition: A | Discharge status: Home / Self Care | Payer: PPO | Source: Ambulatory Visit | Attending: Ophthalmology | Admitting: Ophthalmology

## 2017-09-21 DIAGNOSIS — H2511 Age-related nuclear cataract, right eye: Secondary | ICD-10-CM | POA: Insufficient documentation

## 2017-09-21 DIAGNOSIS — Z96611 Presence of right artificial shoulder joint: Secondary | ICD-10-CM | POA: Insufficient documentation

## 2017-09-21 DIAGNOSIS — H25811 Combined forms of age-related cataract, right eye: Secondary | ICD-10-CM | POA: Diagnosis not present

## 2017-09-21 DIAGNOSIS — J449 Chronic obstructive pulmonary disease, unspecified: Secondary | ICD-10-CM | POA: Diagnosis not present

## 2017-09-21 DIAGNOSIS — Z86718 Personal history of other venous thrombosis and embolism: Secondary | ICD-10-CM | POA: Insufficient documentation

## 2017-09-21 DIAGNOSIS — Z87891 Personal history of nicotine dependence: Secondary | ICD-10-CM | POA: Diagnosis not present

## 2017-09-21 HISTORY — DX: Dizziness and giddiness: R42

## 2017-09-21 HISTORY — PX: CATARACT EXTRACTION W/PHACO: SHX586

## 2017-09-21 HISTORY — DX: Presence of dental prosthetic device (complete) (partial): Z97.2

## 2017-09-21 HISTORY — DX: Unspecified osteoarthritis, unspecified site: M19.90

## 2017-09-21 SURGERY — PHACOEMULSIFICATION, CATARACT, WITH IOL INSERTION
Anesthesia: Monitor Anesthesia Care | Site: Eye | Laterality: Right | Wound class: "Clean "

## 2017-09-21 MED ORDER — NA HYALUR & NA CHOND-NA HYALUR 0.4-0.35 ML IO KIT
PACK | INTRAOCULAR | Status: DC | PRN
  Administered 2017-09-21: 1 mL via INTRAOCULAR

## 2017-09-21 MED ORDER — MIDAZOLAM HCL 2 MG/2ML IJ SOLN
INTRAMUSCULAR | Status: DC | PRN
  Administered 2017-09-21: 2 mg via INTRAVENOUS

## 2017-09-21 MED ORDER — EPINEPHRINE PF 1 MG/ML IJ SOLN
INTRAOCULAR | Status: DC | PRN
  Administered 2017-09-21: 55 mL via OPHTHALMIC

## 2017-09-21 MED ORDER — BRIMONIDINE TARTRATE-TIMOLOL 0.2-0.5 % OP SOLN
OPHTHALMIC | Status: DC | PRN
  Administered 2017-09-21: 1 [drp] via OPHTHALMIC

## 2017-09-21 MED ORDER — ARMC OPHTHALMIC DILATING DROPS
1.0000 "application " | OPHTHALMIC | Status: DC | PRN
  Administered 2017-09-21 (×3): 1 via OPHTHALMIC

## 2017-09-21 MED ORDER — CEFUROXIME OPHTHALMIC INJECTION 1 MG/0.1 ML
INJECTION | OPHTHALMIC | Status: DC | PRN
  Administered 2017-09-21: 0.1 mL via INTRACAMERAL

## 2017-09-21 MED ORDER — LACTATED RINGERS IV SOLN: INTRAVENOUS | Status: DC

## 2017-09-21 MED ORDER — FENTANYL CITRATE (PF) 100 MCG/2ML IJ SOLN
INTRAMUSCULAR | Status: DC | PRN
  Administered 2017-09-21: 50 ug via INTRAVENOUS

## 2017-09-21 MED ORDER — OXYCODONE HCL 5 MG PO TABS: 5.0000 mg | ORAL_TABLET | Freq: Once | ORAL | Status: DC | PRN

## 2017-09-21 MED ORDER — BALANCED SALT IO SOLN
INTRAOCULAR | Status: DC | PRN
  Administered 2017-09-21: 1 mL

## 2017-09-21 MED ORDER — OXYCODONE HCL 5 MG/5ML PO SOLN: 5.0000 mg | Freq: Once | ORAL | Status: DC | PRN

## 2017-09-21 MED ORDER — MOXIFLOXACIN HCL 0.5 % OP SOLN
1.0000 [drp] | OPHTHALMIC | Status: DC | PRN
  Administered 2017-09-21 (×3): 1 [drp] via OPHTHALMIC

## 2017-09-21 SURGICAL SUPPLY — 21 items
CANNULA ANT/CHMB 27G (MISCELLANEOUS) ×1 IMPLANT
CANNULA ANT/CHMB 27GA (MISCELLANEOUS) ×2 IMPLANT
GLOVE SURG LX 7.5 STRW (GLOVE) ×1
GLOVE SURG LX STRL 7.5 STRW (GLOVE) ×1 IMPLANT
GLOVE SURG TRIUMPH 8.0 PF LTX (GLOVE) ×2 IMPLANT
GOWN STRL REUS W/ TWL LRG LVL3 (GOWN DISPOSABLE) ×2 IMPLANT
GOWN STRL REUS W/TWL LRG LVL3 (GOWN DISPOSABLE) ×2
LENS IOL TECNIS ITEC 18.0 (Intraocular Lens) ×1 IMPLANT
MARKER SKIN DUAL TIP RULER LAB (MISCELLANEOUS) ×2 IMPLANT
NDL FILTER BLUNT 18X1 1/2 (NEEDLE) ×1 IMPLANT
NEEDLE FILTER BLUNT 18X 1/2SAF (NEEDLE) ×1
NEEDLE FILTER BLUNT 18X1 1/2 (NEEDLE) ×1 IMPLANT
PACK CATARACT BRASINGTON (MISCELLANEOUS) ×2 IMPLANT
PACK EYE AFTER SURG (MISCELLANEOUS) ×2 IMPLANT
PACK OPTHALMIC (MISCELLANEOUS) ×2 IMPLANT
SPONGE SURG I SPEAR (MISCELLANEOUS) ×1 IMPLANT
SYR 3ML LL SCALE MARK (SYRINGE) ×2 IMPLANT
SYR 5ML LL (SYRINGE) ×2 IMPLANT
SYR TB 1ML LUER SLIP (SYRINGE) ×2 IMPLANT
WATER STERILE IRR 500ML POUR (IV SOLUTION) ×2 IMPLANT
WIPE NON LINTING 3.25X3.25 (MISCELLANEOUS) ×2 IMPLANT

## 2017-09-21 NOTE — Anesthesia Preprocedure Evaluation (Signed)
Anesthesia Evaluation  Patient identified by MRN, date of birth, ID band  Reviewed: NPO status   History of Anesthesia Complications Negative for: history of anesthetic complications  Airway Mallampati: II  TM Distance: >3 FB Neck ROM: full    Dental  (+) Upper Dentures, Lower Dentures   Pulmonary COPD (mild), former smoker (1990s),    Pulmonary exam normal        Cardiovascular Exercise Tolerance: Good + DVT (1975)  negative cardio ROS Normal cardiovascular exam     Neuro/Psych TIA (? 1980s)negative neurological ROS  negative psych ROS   GI/Hepatic Neg liver ROS, GERD  Controlled,  Endo/Other  negative endocrine ROS  Renal/GU Benign adrenal mass  negative genitourinary   Musculoskeletal  (+) Arthritis ,   Abdominal   Peds  Hematology negative hematology ROS (+)   Anesthesia Other Findings   Reproductive/Obstetrics                             Anesthesia Physical Anesthesia Plan  ASA: II  Anesthesia Plan: MAC   Post-op Pain Management:    Induction:   PONV Risk Score and Plan:   Airway Management Planned:   Additional Equipment:   Intra-op Plan:   Post-operative Plan:   Informed Consent: I have reviewed the patients History and Physical, chart, labs and discussed the procedure including the risks, benefits and alternatives for the proposed anesthesia with the patient or authorized representative who has indicated his/her understanding and acceptance.     Plan Discussed with: CRNA  Anesthesia Plan Comments:         Anesthesia Quick Evaluation

## 2017-09-21 NOTE — H&P (Signed)
The History and Physical notes are on paper, have been signed, and are to be scanned. The patient remains stable and unchanged from the H&P.   Previous H&P reviewed, patient examined, and there are no changes.  Sherron Mummert 09/21/2017 10:09 AM

## 2017-09-21 NOTE — Op Note (Signed)
LOCATION:  [facility]   PREOPERATIVE DIAGNOSIS:    Nuclear sclerotic cataract right eye. H25.11   POSTOPERATIVE DIAGNOSIS:  Nuclear sclerotic cataract right eye.     PROCEDURE:  Phacoemusification with posterior chamber intraocular lens placement of the right eye   LENS:   Implant Name Type Inv. Item Serial No. Manufacturer Lot No. LRB No. Used  LENS IOL DIOP 18.0 - [id number] Intraocular Lens LENS IOL DIOP 18.0 [id number]  Right 1        ULTRASOUND TIME: 16 % of 1 minutes, 8 seconds.  CDE 11.0   SURGEON:  Wyonia Hough, MD   ANESTHESIA:  Topical with tetracaine drops and 2% Xylocaine jelly, augmented with 1% preservative-free intracameral lidocaine.    COMPLICATIONS:  None.   DESCRIPTION OF PROCEDURE:  The patient was identified in the holding room and transported to the operating room and placed in the supine position under the operating microscope.  The right eye was identified as the operative eye and it was prepped and draped in the usual sterile ophthalmic fashion.   A 1 millimeter clear-corneal paracentesis was made at the 12:00 position.  0.5 ml of preservative-free 1% lidocaine was injected into the anterior chamber. The anterior chamber was filled with Viscoat viscoelastic.  A 2.4 millimeter keratome was used to make a near-clear corneal incision at the 9:00 position.  A curvilinear capsulorrhexis was made with a cystotome and capsulorrhexis forceps.  Balanced salt solution was used to hydrodissect and hydrodelineate the nucleus.   Phacoemulsification was then used in stop and chop fashion to remove the lens nucleus and epinucleus.  The remaining cortex was then removed using the irrigation and aspiration handpiece. Provisc was then placed into the capsular bag to distend it for lens placement.  A lens was then injected into the capsular bag.  The remaining viscoelastic was aspirated.   Wounds were hydrated with balanced salt solution.  The anterior  chamber was inflated to a physiologic pressure with balanced salt solution.  No wound leaks were noted. Cefuroxime 0.1 ml of a 10mg /ml solution was injected into the anterior chamber for a dose of 1 mg of intracameral antibiotic at the completion of the case.   Timolol and Brimonidine drops were applied to the eye.  The patient was taken to the recovery room in stable condition without complications of anesthesia or surgery.   Stacy Hoffman 09/21/2017, 11:05 AM

## 2017-09-21 NOTE — Anesthesia Postprocedure Evaluation (Signed)
Anesthesia Post Note  Patient: Stacy Hoffman  Procedure(s) Performed: CATARACT EXTRACTION PHACO AND INTRAOCULAR LENS PLACEMENT (IOC) RIGHT (Right Eye)  Patient location during evaluation: PACU Anesthesia Type: MAC Level of consciousness: awake and alert Pain management: pain level controlled Vital Signs Assessment: post-procedure vital signs reviewed and stable Respiratory status: spontaneous breathing, nonlabored ventilation, respiratory function stable and patient connected to nasal cannula oxygen Cardiovascular status: stable and blood pressure returned to baseline Postop Assessment: no apparent nausea or vomiting Anesthetic complications: no    Chelsee Hosie

## 2017-09-21 NOTE — Anesthesia Procedure Notes (Signed)
Procedure Name: MAC Performed by: Idalia Allbritton, CRNA Pre-anesthesia Checklist: Patient identified, Emergency Drugs available, Suction available, Timeout performed and Patient being monitored Patient Re-evaluated:Patient Re-evaluated prior to induction Oxygen Delivery Method: Nasal cannula Placement Confirmation: positive ETCO2       

## 2017-09-21 NOTE — Transfer of Care (Signed)
Immediate Anesthesia Transfer of Care Note  Patient: Stacy Hoffman  Procedure(s) Performed: CATARACT EXTRACTION PHACO AND INTRAOCULAR LENS PLACEMENT (IOC) RIGHT (Right Eye)  Patient Location: PACU  Anesthesia Type: MAC  Level of Consciousness: awake, alert  and patient cooperative  Airway and Oxygen Therapy: Patient Spontanous Breathing and Patient connected to supplemental oxygen  Post-op Assessment: Post-op Vital signs reviewed, Patient's Cardiovascular Status Stable, Respiratory Function Stable, Patent Airway and No signs of Nausea or vomiting  Post-op Vital Signs: Reviewed and stable  Complications: No apparent anesthesia complications

## 2017-09-22 ENCOUNTER — Encounter: Payer: Self-pay | Admitting: Ophthalmology

## 2017-10-06 DIAGNOSIS — M62838 Other muscle spasm: Secondary | ICD-10-CM | POA: Diagnosis not present

## 2017-10-06 DIAGNOSIS — G894 Chronic pain syndrome: Secondary | ICD-10-CM | POA: Diagnosis not present

## 2017-10-06 DIAGNOSIS — G5602 Carpal tunnel syndrome, left upper limb: Secondary | ICD-10-CM | POA: Diagnosis not present

## 2017-10-06 DIAGNOSIS — M25511 Pain in right shoulder: Secondary | ICD-10-CM | POA: Diagnosis not present

## 2017-10-06 DIAGNOSIS — Z79891 Long term (current) use of opiate analgesic: Secondary | ICD-10-CM | POA: Diagnosis not present

## 2017-10-06 DIAGNOSIS — M25512 Pain in left shoulder: Secondary | ICD-10-CM | POA: Diagnosis not present

## 2017-10-10 ENCOUNTER — Encounter: Payer: Self-pay | Admitting: *Deleted

## 2017-10-10 ENCOUNTER — Other Ambulatory Visit: Payer: Self-pay

## 2017-10-12 DIAGNOSIS — H2512 Age-related nuclear cataract, left eye: Secondary | ICD-10-CM | POA: Diagnosis not present

## 2017-10-13 ENCOUNTER — Other Ambulatory Visit: Payer: Self-pay | Admitting: Internal Medicine

## 2017-10-13 DIAGNOSIS — Z1231 Encounter for screening mammogram for malignant neoplasm of breast: Secondary | ICD-10-CM

## 2017-10-13 NOTE — Discharge Instructions (Signed)

## 2017-10-17 ENCOUNTER — Ambulatory Visit: Payer: PPO | Admitting: Anesthesiology

## 2017-10-17 ENCOUNTER — Ambulatory Visit
Admission: RE | Admit: 2017-10-17 | Discharge: 2017-10-17 | Disposition: A | Discharge status: Home / Self Care | Payer: PPO | Source: Ambulatory Visit | Attending: Ophthalmology | Admitting: Ophthalmology

## 2017-10-17 ENCOUNTER — Encounter
Admission: RE | Disposition: A | Discharge status: Home / Self Care | Payer: Self-pay | Source: Ambulatory Visit | Attending: Ophthalmology

## 2017-10-17 DIAGNOSIS — H25812 Combined forms of age-related cataract, left eye: Secondary | ICD-10-CM | POA: Diagnosis not present

## 2017-10-17 DIAGNOSIS — Z86718 Personal history of other venous thrombosis and embolism: Secondary | ICD-10-CM | POA: Diagnosis not present

## 2017-10-17 DIAGNOSIS — Z87891 Personal history of nicotine dependence: Secondary | ICD-10-CM | POA: Diagnosis not present

## 2017-10-17 DIAGNOSIS — K219 Gastro-esophageal reflux disease without esophagitis: Secondary | ICD-10-CM | POA: Insufficient documentation

## 2017-10-17 DIAGNOSIS — J449 Chronic obstructive pulmonary disease, unspecified: Secondary | ICD-10-CM | POA: Insufficient documentation

## 2017-10-17 DIAGNOSIS — H2512 Age-related nuclear cataract, left eye: Secondary | ICD-10-CM | POA: Diagnosis not present

## 2017-10-17 DIAGNOSIS — Z79899 Other long term (current) drug therapy: Secondary | ICD-10-CM | POA: Diagnosis not present

## 2017-10-17 DIAGNOSIS — M1990 Unspecified osteoarthritis, unspecified site: Secondary | ICD-10-CM | POA: Diagnosis not present

## 2017-10-17 DIAGNOSIS — Z7982 Long term (current) use of aspirin: Secondary | ICD-10-CM | POA: Insufficient documentation

## 2017-10-17 DIAGNOSIS — Z881 Allergy status to other antibiotic agents status: Secondary | ICD-10-CM | POA: Diagnosis not present

## 2017-10-17 HISTORY — PX: CATARACT EXTRACTION W/PHACO: SHX586

## 2017-10-17 SURGERY — PHACOEMULSIFICATION, CATARACT, WITH IOL INSERTION
Anesthesia: Monitor Anesthesia Care | Site: Eye | Laterality: Left

## 2017-10-17 MED ORDER — MOXIFLOXACIN HCL 0.5 % OP SOLN
1.0000 [drp] | OPHTHALMIC | Status: DC | PRN
  Administered 2017-10-17 (×3): 1 [drp] via OPHTHALMIC

## 2017-10-17 MED ORDER — NA HYALUR & NA CHOND-NA HYALUR 0.4-0.35 ML IO KIT
PACK | INTRAOCULAR | Status: DC | PRN
  Administered 2017-10-17: 1 mL via INTRAOCULAR

## 2017-10-17 MED ORDER — FENTANYL CITRATE (PF) 100 MCG/2ML IJ SOLN
INTRAMUSCULAR | Status: DC | PRN
  Administered 2017-10-17: 50 ug via INTRAVENOUS

## 2017-10-17 MED ORDER — EPINEPHRINE PF 1 MG/ML IJ SOLN
INTRAOCULAR | Status: DC | PRN
  Administered 2017-10-17: 68 mL via OPHTHALMIC

## 2017-10-17 MED ORDER — ARMC OPHTHALMIC DILATING DROPS
1.0000 "application " | OPHTHALMIC | Status: DC | PRN
  Administered 2017-10-17 (×3): 1 via OPHTHALMIC

## 2017-10-17 MED ORDER — BRIMONIDINE TARTRATE-TIMOLOL 0.2-0.5 % OP SOLN
OPHTHALMIC | Status: DC | PRN
  Administered 2017-10-17: 1 [drp] via OPHTHALMIC

## 2017-10-17 MED ORDER — LACTATED RINGERS IV SOLN: 10.0000 mL/h | INTRAVENOUS | Status: DC

## 2017-10-17 MED ORDER — LIDOCAINE HCL (PF) 2 % IJ SOLN
INTRAOCULAR | Status: DC | PRN
  Administered 2017-10-17: 1 mL via INTRAMUSCULAR

## 2017-10-17 MED ORDER — MIDAZOLAM HCL 2 MG/2ML IJ SOLN
INTRAMUSCULAR | Status: DC | PRN
  Administered 2017-10-17: 1 mg via INTRAVENOUS

## 2017-10-17 MED ORDER — CEFUROXIME OPHTHALMIC INJECTION 1 MG/0.1 ML
INJECTION | OPHTHALMIC | Status: DC | PRN
  Administered 2017-10-17: 0.1 mL via INTRACAMERAL

## 2017-10-17 SURGICAL SUPPLY — 27 items

## 2017-10-17 NOTE — H&P (Signed)
The History and Physical notes are on paper, have been signed, and are to be scanned. The patient remains stable and unchanged from the H&P.   Previous H&P reviewed, patient examined, and there are no changes.  Jena Tegeler 10/17/2017 9:30 AM

## 2017-10-17 NOTE — Transfer of Care (Signed)
Immediate Anesthesia Transfer of Care Note  Patient: Stacy Hoffman  Procedure(s) Performed: CATARACT EXTRACTION PHACO AND INTRAOCULAR LENS PLACEMENT (IOC) (Left Eye)  Patient Location: PACU  Anesthesia Type: MAC  Level of Consciousness: awake, alert  and patient cooperative  Airway and Oxygen Therapy: Patient Spontanous Breathing and Patient connected to supplemental oxygen  Post-op Assessment: Post-op Vital signs reviewed, Patient's Cardiovascular Status Stable, Respiratory Function Stable, Patent Airway and No signs of Nausea or vomiting  Post-op Vital Signs: Reviewed and stable  Complications: No apparent anesthesia complications

## 2017-10-17 NOTE — Anesthesia Postprocedure Evaluation (Signed)
Anesthesia Post Note  Patient: Stacy Hoffman  Procedure(s) Performed: CATARACT EXTRACTION PHACO AND INTRAOCULAR LENS PLACEMENT (IOC) (Left Eye)  Patient location during evaluation: PACU Anesthesia Type: MAC Level of consciousness: awake and alert Pain management: pain level controlled Vital Signs Assessment: post-procedure vital signs reviewed and stable Respiratory status: spontaneous breathing, nonlabored ventilation, respiratory function stable and patient connected to nasal cannula oxygen Cardiovascular status: stable and blood pressure returned to baseline Postop Assessment: no apparent nausea or vomiting Anesthetic complications: no    Alisa Graff

## 2017-10-17 NOTE — Anesthesia Preprocedure Evaluation (Signed)
Anesthesia Evaluation  Patient identified by MRN, date of birth, ID band  Reviewed: NPO status   History of Anesthesia Complications Negative for: history of anesthetic complications  Airway Mallampati: II  TM Distance: >3 FB Neck ROM: full    Dental  (+) Upper Dentures, Lower Dentures   Pulmonary COPD (mild), former smoker,    Pulmonary exam normal        Cardiovascular Exercise Tolerance: Good + DVT (1975)  negative cardio ROS Normal cardiovascular exam     Neuro/Psych TIA (? 1980s)negative neurological ROS  negative psych ROS   GI/Hepatic Neg liver ROS, GERD  Controlled,  Endo/Other  negative endocrine ROS  Renal/GU Benign adrenal mass  negative genitourinary   Musculoskeletal  (+) Arthritis ,   Abdominal   Peds  Hematology negative hematology ROS (+)   Anesthesia Other Findings   Reproductive/Obstetrics                             Anesthesia Physical  Anesthesia Plan  ASA: II  Anesthesia Plan: MAC   Post-op Pain Management:    Induction:   PONV Risk Score and Plan:   Airway Management Planned:   Additional Equipment:   Intra-op Plan:   Post-operative Plan:   Informed Consent: I have reviewed the patients History and Physical, chart, labs and discussed the procedure including the risks, benefits and alternatives for the proposed anesthesia with the patient or authorized representative who has indicated his/her understanding and acceptance.     Plan Discussed with: CRNA  Anesthesia Plan Comments:         Anesthesia Quick Evaluation

## 2017-10-17 NOTE — Op Note (Signed)
OPERATIVE NOTE  Stacy Hoffman [id number] 10/17/2017   PREOPERATIVE DIAGNOSIS:  Nuclear sclerotic cataract left eye. H25.12   POSTOPERATIVE DIAGNOSIS:    Nuclear sclerotic cataract left eye.     PROCEDURE:  Phacoemusification with posterior chamber intraocular lens placement of the left eye   LENS:   Implant Name Type Inv. Item Serial No. Manufacturer Lot No. LRB No. Used  LENS IOL DIOP 17.5 - [id number] Intraocular Lens LENS IOL DIOP 17.5 [id number] AMO  Left 1        ULTRASOUND TIME: 9  % of 1 minutes 21 seconds, CDE 7.4  SURGEON:  Wyonia Hough, MD   ANESTHESIA:  Topical with tetracaine drops and 2% Xylocaine jelly, augmented with 1% preservative-free intracameral lidocaine.    COMPLICATIONS:  None.   DESCRIPTION OF PROCEDURE:  The patient was identified in the holding room and transported to the operating room and placed in the supine position under the operating microscope.  The left eye was identified as the operative eye and it was prepped and draped in the usual sterile ophthalmic fashion.   A 1 millimeter clear-corneal paracentesis was made at the 1:30 position.  0.5 ml of preservative-free 1% lidocaine was injected into the anterior chamber.  The anterior chamber was filled with Viscoat viscoelastic.  A 2.4 millimeter keratome was used to make a near-clear corneal incision at the 10:30 position.  .  A curvilinear capsulorrhexis was made with a cystotome and capsulorrhexis forceps.  Balanced salt solution was used to hydrodissect and hydrodelineate the nucleus.   Phacoemulsification was then used in stop and chop fashion to remove the lens nucleus and epinucleus.  The remaining cortex was then removed using the irrigation and aspiration handpiece. Provisc was then placed into the capsular bag to distend it for lens placement.  A lens was then injected into the capsular bag.  The remaining viscoelastic was aspirated.   Wounds were hydrated with balanced salt  solution.  The anterior chamber was inflated to a physiologic pressure with balanced salt solution.  No wound leaks were noted. Cefuroxime 0.1 ml of a 10mg /ml solution was injected into the anterior chamber for a dose of 1 mg of intracameral antibiotic at the completion of the case.   Timolol and Brimonidine drops were applied to the eye.  The patient was taken to the recovery room in stable condition without complications of anesthesia or surgery.  Stacy Hoffman 10/17/2017, 10:15 AM

## 2017-10-17 NOTE — Anesthesia Procedure Notes (Signed)
Procedure Name: MAC Date/Time: 10/17/2017 9:58 AM Performed by: Janna Arch, CRNA Pre-anesthesia Checklist: Patient identified, Emergency Drugs available, Suction available and Patient being monitored Patient Re-evaluated:Patient Re-evaluated prior to induction Oxygen Delivery Method: Nasal cannula

## 2017-10-18 ENCOUNTER — Encounter: Payer: Self-pay | Admitting: Ophthalmology

## 2017-10-21 DIAGNOSIS — Z5181 Encounter for therapeutic drug level monitoring: Secondary | ICD-10-CM | POA: Diagnosis not present

## 2017-10-28 DIAGNOSIS — E559 Vitamin D deficiency, unspecified: Secondary | ICD-10-CM | POA: Diagnosis not present

## 2017-10-28 DIAGNOSIS — R030 Elevated blood-pressure reading, without diagnosis of hypertension: Secondary | ICD-10-CM | POA: Diagnosis not present

## 2017-10-28 DIAGNOSIS — K219 Gastro-esophageal reflux disease without esophagitis: Secondary | ICD-10-CM | POA: Diagnosis not present

## 2017-10-28 DIAGNOSIS — I83893 Varicose veins of bilateral lower extremities with other complications: Secondary | ICD-10-CM | POA: Diagnosis not present

## 2017-10-28 DIAGNOSIS — E7800 Pure hypercholesterolemia, unspecified: Secondary | ICD-10-CM | POA: Diagnosis not present

## 2017-11-25 ENCOUNTER — Ambulatory Visit
Admission: RE | Admit: 2017-11-25 | Discharge: 2017-11-25 | Disposition: A | Discharge status: Home / Self Care | Payer: PPO | Source: Ambulatory Visit | Attending: Internal Medicine | Admitting: Internal Medicine

## 2017-11-25 DIAGNOSIS — Z1231 Encounter for screening mammogram for malignant neoplasm of breast: Secondary | ICD-10-CM | POA: Insufficient documentation

## 2017-12-19 DIAGNOSIS — L821 Other seborrheic keratosis: Secondary | ICD-10-CM | POA: Diagnosis not present

## 2017-12-19 DIAGNOSIS — Z1283 Encounter for screening for malignant neoplasm of skin: Secondary | ICD-10-CM | POA: Diagnosis not present

## 2017-12-19 DIAGNOSIS — L720 Epidermal cyst: Secondary | ICD-10-CM | POA: Diagnosis not present

## 2017-12-19 DIAGNOSIS — L578 Other skin changes due to chronic exposure to nonionizing radiation: Secondary | ICD-10-CM | POA: Diagnosis not present

## 2017-12-19 DIAGNOSIS — L570 Actinic keratosis: Secondary | ICD-10-CM | POA: Diagnosis not present

## 2017-12-19 DIAGNOSIS — Z872 Personal history of diseases of the skin and subcutaneous tissue: Secondary | ICD-10-CM | POA: Diagnosis not present

## 2017-12-29 DIAGNOSIS — K5903 Drug induced constipation: Secondary | ICD-10-CM | POA: Diagnosis not present

## 2017-12-29 DIAGNOSIS — M25512 Pain in left shoulder: Secondary | ICD-10-CM | POA: Diagnosis not present

## 2017-12-29 DIAGNOSIS — M62838 Other muscle spasm: Secondary | ICD-10-CM | POA: Diagnosis not present

## 2017-12-29 DIAGNOSIS — Z79891 Long term (current) use of opiate analgesic: Secondary | ICD-10-CM | POA: Diagnosis not present

## 2017-12-29 DIAGNOSIS — M25511 Pain in right shoulder: Secondary | ICD-10-CM | POA: Diagnosis not present

## 2017-12-29 DIAGNOSIS — G894 Chronic pain syndrome: Secondary | ICD-10-CM | POA: Diagnosis not present

## 2018-03-23 DIAGNOSIS — M25512 Pain in left shoulder: Secondary | ICD-10-CM | POA: Diagnosis not present

## 2018-03-23 DIAGNOSIS — M25511 Pain in right shoulder: Secondary | ICD-10-CM | POA: Diagnosis not present

## 2018-03-23 DIAGNOSIS — M62838 Other muscle spasm: Secondary | ICD-10-CM | POA: Diagnosis not present

## 2018-03-23 DIAGNOSIS — G894 Chronic pain syndrome: Secondary | ICD-10-CM | POA: Diagnosis not present

## 2018-03-23 DIAGNOSIS — R52 Pain, unspecified: Secondary | ICD-10-CM | POA: Diagnosis not present

## 2018-03-23 DIAGNOSIS — K5903 Drug induced constipation: Secondary | ICD-10-CM | POA: Diagnosis not present

## 2018-03-23 DIAGNOSIS — Z79891 Long term (current) use of opiate analgesic: Secondary | ICD-10-CM | POA: Diagnosis not present

## 2018-04-26 DIAGNOSIS — K219 Gastro-esophageal reflux disease without esophagitis: Secondary | ICD-10-CM | POA: Diagnosis not present

## 2018-04-26 DIAGNOSIS — E7800 Pure hypercholesterolemia, unspecified: Secondary | ICD-10-CM | POA: Diagnosis not present

## 2018-05-03 DIAGNOSIS — Z0000 Encounter for general adult medical examination without abnormal findings: Secondary | ICD-10-CM | POA: Diagnosis not present

## 2018-05-03 DIAGNOSIS — Z0001 Encounter for general adult medical examination with abnormal findings: Secondary | ICD-10-CM | POA: Diagnosis not present

## 2018-05-03 DIAGNOSIS — E559 Vitamin D deficiency, unspecified: Secondary | ICD-10-CM | POA: Diagnosis not present

## 2018-05-03 DIAGNOSIS — K219 Gastro-esophageal reflux disease without esophagitis: Secondary | ICD-10-CM | POA: Diagnosis not present

## 2018-05-03 DIAGNOSIS — R079 Chest pain, unspecified: Secondary | ICD-10-CM | POA: Diagnosis not present

## 2018-05-03 DIAGNOSIS — E7800 Pure hypercholesterolemia, unspecified: Secondary | ICD-10-CM | POA: Diagnosis not present

## 2018-05-25 DIAGNOSIS — E7800 Pure hypercholesterolemia, unspecified: Secondary | ICD-10-CM | POA: Diagnosis not present

## 2018-05-25 DIAGNOSIS — I208 Other forms of angina pectoris: Secondary | ICD-10-CM | POA: Diagnosis not present

## 2018-05-25 DIAGNOSIS — I10 Essential (primary) hypertension: Secondary | ICD-10-CM | POA: Diagnosis not present

## 2018-06-15 DIAGNOSIS — R52 Pain, unspecified: Secondary | ICD-10-CM | POA: Diagnosis not present

## 2018-06-15 DIAGNOSIS — K5903 Drug induced constipation: Secondary | ICD-10-CM | POA: Diagnosis not present

## 2018-06-15 DIAGNOSIS — G894 Chronic pain syndrome: Secondary | ICD-10-CM | POA: Diagnosis not present

## 2018-06-15 DIAGNOSIS — M62838 Other muscle spasm: Secondary | ICD-10-CM | POA: Diagnosis not present

## 2018-06-15 DIAGNOSIS — M25512 Pain in left shoulder: Secondary | ICD-10-CM | POA: Diagnosis not present

## 2018-06-15 DIAGNOSIS — M25511 Pain in right shoulder: Secondary | ICD-10-CM | POA: Diagnosis not present

## 2018-06-15 DIAGNOSIS — Z79891 Long term (current) use of opiate analgesic: Secondary | ICD-10-CM | POA: Diagnosis not present

## 2018-06-27 DIAGNOSIS — I208 Other forms of angina pectoris: Secondary | ICD-10-CM | POA: Diagnosis not present

## 2018-06-30 DIAGNOSIS — R0789 Other chest pain: Secondary | ICD-10-CM | POA: Diagnosis not present

## 2018-06-30 DIAGNOSIS — E7800 Pure hypercholesterolemia, unspecified: Secondary | ICD-10-CM | POA: Diagnosis not present

## 2018-06-30 DIAGNOSIS — I83893 Varicose veins of bilateral lower extremities with other complications: Secondary | ICD-10-CM | POA: Diagnosis not present

## 2018-06-30 DIAGNOSIS — I10 Essential (primary) hypertension: Secondary | ICD-10-CM | POA: Diagnosis not present

## 2018-09-13 DIAGNOSIS — Z79891 Long term (current) use of opiate analgesic: Secondary | ICD-10-CM | POA: Diagnosis not present

## 2018-09-13 DIAGNOSIS — G894 Chronic pain syndrome: Secondary | ICD-10-CM | POA: Diagnosis not present

## 2018-09-13 DIAGNOSIS — R52 Pain, unspecified: Secondary | ICD-10-CM | POA: Diagnosis not present

## 2018-09-13 DIAGNOSIS — K5903 Drug induced constipation: Secondary | ICD-10-CM | POA: Diagnosis not present

## 2018-09-13 DIAGNOSIS — M62838 Other muscle spasm: Secondary | ICD-10-CM | POA: Diagnosis not present

## 2018-09-13 DIAGNOSIS — M25512 Pain in left shoulder: Secondary | ICD-10-CM | POA: Diagnosis not present

## 2018-09-13 DIAGNOSIS — M25511 Pain in right shoulder: Secondary | ICD-10-CM | POA: Diagnosis not present

## 2018-09-13 DIAGNOSIS — Z79899 Other long term (current) drug therapy: Secondary | ICD-10-CM | POA: Diagnosis not present

## 2018-09-13 DIAGNOSIS — Z5181 Encounter for therapeutic drug level monitoring: Secondary | ICD-10-CM | POA: Diagnosis not present

## 2018-10-25 DIAGNOSIS — K219 Gastro-esophageal reflux disease without esophagitis: Secondary | ICD-10-CM | POA: Diagnosis not present

## 2018-11-01 DIAGNOSIS — K219 Gastro-esophageal reflux disease without esophagitis: Secondary | ICD-10-CM | POA: Diagnosis not present

## 2018-11-01 DIAGNOSIS — E559 Vitamin D deficiency, unspecified: Secondary | ICD-10-CM | POA: Diagnosis not present

## 2018-11-01 DIAGNOSIS — Z0000 Encounter for general adult medical examination without abnormal findings: Secondary | ICD-10-CM | POA: Diagnosis not present

## 2018-11-01 DIAGNOSIS — I83893 Varicose veins of bilateral lower extremities with other complications: Secondary | ICD-10-CM | POA: Diagnosis not present

## 2018-11-01 DIAGNOSIS — I10 Essential (primary) hypertension: Secondary | ICD-10-CM | POA: Diagnosis not present

## 2018-11-01 DIAGNOSIS — E7800 Pure hypercholesterolemia, unspecified: Secondary | ICD-10-CM | POA: Diagnosis not present

## 2018-11-08 ENCOUNTER — Other Ambulatory Visit: Payer: Self-pay | Admitting: Internal Medicine

## 2018-11-08 DIAGNOSIS — Z1231 Encounter for screening mammogram for malignant neoplasm of breast: Secondary | ICD-10-CM

## 2018-11-27 ENCOUNTER — Ambulatory Visit
Admission: RE | Admit: 2018-11-27 | Discharge: 2018-11-27 | Disposition: A | Discharge status: Home / Self Care | Payer: PPO | Source: Ambulatory Visit | Attending: Internal Medicine | Admitting: Internal Medicine

## 2018-11-27 ENCOUNTER — Other Ambulatory Visit: Payer: Self-pay

## 2018-11-27 DIAGNOSIS — Z1231 Encounter for screening mammogram for malignant neoplasm of breast: Secondary | ICD-10-CM | POA: Insufficient documentation

## 2018-12-11 DIAGNOSIS — M25511 Pain in right shoulder: Secondary | ICD-10-CM | POA: Diagnosis not present

## 2018-12-11 DIAGNOSIS — M533 Sacrococcygeal disorders, not elsewhere classified: Secondary | ICD-10-CM | POA: Diagnosis not present

## 2018-12-11 DIAGNOSIS — M25512 Pain in left shoulder: Secondary | ICD-10-CM | POA: Diagnosis not present

## 2018-12-11 DIAGNOSIS — R52 Pain, unspecified: Secondary | ICD-10-CM | POA: Diagnosis not present

## 2018-12-11 DIAGNOSIS — G894 Chronic pain syndrome: Secondary | ICD-10-CM | POA: Diagnosis not present

## 2018-12-21 DIAGNOSIS — L578 Other skin changes due to chronic exposure to nonionizing radiation: Secondary | ICD-10-CM | POA: Diagnosis not present

## 2018-12-21 DIAGNOSIS — L570 Actinic keratosis: Secondary | ICD-10-CM | POA: Diagnosis not present

## 2018-12-21 DIAGNOSIS — Z872 Personal history of diseases of the skin and subcutaneous tissue: Secondary | ICD-10-CM | POA: Diagnosis not present

## 2018-12-21 DIAGNOSIS — L821 Other seborrheic keratosis: Secondary | ICD-10-CM | POA: Diagnosis not present

## 2019-01-01 DIAGNOSIS — M533 Sacrococcygeal disorders, not elsewhere classified: Secondary | ICD-10-CM | POA: Diagnosis not present

## 2019-01-03 DIAGNOSIS — M533 Sacrococcygeal disorders, not elsewhere classified: Secondary | ICD-10-CM | POA: Diagnosis not present

## 2019-01-10 DIAGNOSIS — M533 Sacrococcygeal disorders, not elsewhere classified: Secondary | ICD-10-CM | POA: Diagnosis not present

## 2019-01-10 DIAGNOSIS — M47896 Other spondylosis, lumbar region: Secondary | ICD-10-CM | POA: Diagnosis not present

## 2019-01-10 DIAGNOSIS — K5903 Drug induced constipation: Secondary | ICD-10-CM | POA: Diagnosis not present

## 2019-01-10 DIAGNOSIS — M25512 Pain in left shoulder: Secondary | ICD-10-CM | POA: Diagnosis not present

## 2019-01-10 DIAGNOSIS — M4696 Unspecified inflammatory spondylopathy, lumbar region: Secondary | ICD-10-CM | POA: Diagnosis not present

## 2019-01-10 DIAGNOSIS — Z79891 Long term (current) use of opiate analgesic: Secondary | ICD-10-CM | POA: Diagnosis not present

## 2019-01-10 DIAGNOSIS — R52 Pain, unspecified: Secondary | ICD-10-CM | POA: Diagnosis not present

## 2019-01-10 DIAGNOSIS — G894 Chronic pain syndrome: Secondary | ICD-10-CM | POA: Diagnosis not present

## 2019-01-10 DIAGNOSIS — M25511 Pain in right shoulder: Secondary | ICD-10-CM | POA: Diagnosis not present

## 2019-01-29 DIAGNOSIS — Z961 Presence of intraocular lens: Secondary | ICD-10-CM | POA: Diagnosis not present

## 2019-03-14 DIAGNOSIS — M25511 Pain in right shoulder: Secondary | ICD-10-CM | POA: Diagnosis not present

## 2019-03-14 DIAGNOSIS — M4696 Unspecified inflammatory spondylopathy, lumbar region: Secondary | ICD-10-CM | POA: Diagnosis not present

## 2019-03-14 DIAGNOSIS — Z79891 Long term (current) use of opiate analgesic: Secondary | ICD-10-CM | POA: Diagnosis not present

## 2019-03-14 DIAGNOSIS — G894 Chronic pain syndrome: Secondary | ICD-10-CM | POA: Diagnosis not present

## 2019-03-14 DIAGNOSIS — K5903 Drug induced constipation: Secondary | ICD-10-CM | POA: Diagnosis not present

## 2019-03-14 DIAGNOSIS — M533 Sacrococcygeal disorders, not elsewhere classified: Secondary | ICD-10-CM | POA: Diagnosis not present

## 2019-03-14 DIAGNOSIS — M47896 Other spondylosis, lumbar region: Secondary | ICD-10-CM | POA: Diagnosis not present

## 2019-03-14 DIAGNOSIS — M25512 Pain in left shoulder: Secondary | ICD-10-CM | POA: Diagnosis not present

## 2019-03-14 DIAGNOSIS — R52 Pain, unspecified: Secondary | ICD-10-CM | POA: Diagnosis not present

## 2019-05-01 DIAGNOSIS — I10 Essential (primary) hypertension: Secondary | ICD-10-CM | POA: Diagnosis not present

## 2019-05-01 DIAGNOSIS — E7800 Pure hypercholesterolemia, unspecified: Secondary | ICD-10-CM | POA: Diagnosis not present

## 2019-05-08 DIAGNOSIS — E7800 Pure hypercholesterolemia, unspecified: Secondary | ICD-10-CM | POA: Diagnosis not present

## 2019-05-08 DIAGNOSIS — E559 Vitamin D deficiency, unspecified: Secondary | ICD-10-CM | POA: Diagnosis not present

## 2019-05-08 DIAGNOSIS — K219 Gastro-esophageal reflux disease without esophagitis: Secondary | ICD-10-CM | POA: Diagnosis not present

## 2019-05-08 DIAGNOSIS — E278 Other specified disorders of adrenal gland: Secondary | ICD-10-CM | POA: Diagnosis not present

## 2019-05-08 DIAGNOSIS — I10 Essential (primary) hypertension: Secondary | ICD-10-CM | POA: Diagnosis not present

## 2019-05-08 DIAGNOSIS — I83893 Varicose veins of bilateral lower extremities with other complications: Secondary | ICD-10-CM | POA: Diagnosis not present

## 2019-05-08 DIAGNOSIS — Z0001 Encounter for general adult medical examination with abnormal findings: Secondary | ICD-10-CM | POA: Diagnosis not present

## 2019-05-16 DIAGNOSIS — K5903 Drug induced constipation: Secondary | ICD-10-CM | POA: Diagnosis not present

## 2019-05-16 DIAGNOSIS — Z79891 Long term (current) use of opiate analgesic: Secondary | ICD-10-CM | POA: Diagnosis not present

## 2019-05-16 DIAGNOSIS — M47896 Other spondylosis, lumbar region: Secondary | ICD-10-CM | POA: Diagnosis not present

## 2019-05-16 DIAGNOSIS — G894 Chronic pain syndrome: Secondary | ICD-10-CM | POA: Diagnosis not present

## 2019-05-16 DIAGNOSIS — M25512 Pain in left shoulder: Secondary | ICD-10-CM | POA: Diagnosis not present

## 2019-05-16 DIAGNOSIS — Z79899 Other long term (current) drug therapy: Secondary | ICD-10-CM | POA: Diagnosis not present

## 2019-05-16 DIAGNOSIS — M25511 Pain in right shoulder: Secondary | ICD-10-CM | POA: Diagnosis not present

## 2019-05-16 DIAGNOSIS — M533 Sacrococcygeal disorders, not elsewhere classified: Secondary | ICD-10-CM | POA: Diagnosis not present

## 2019-05-16 DIAGNOSIS — Z5181 Encounter for therapeutic drug level monitoring: Secondary | ICD-10-CM | POA: Diagnosis not present

## 2019-05-16 DIAGNOSIS — R52 Pain, unspecified: Secondary | ICD-10-CM | POA: Diagnosis not present

## 2019-05-16 DIAGNOSIS — M4696 Unspecified inflammatory spondylopathy, lumbar region: Secondary | ICD-10-CM | POA: Diagnosis not present

## 2019-07-11 DIAGNOSIS — M47896 Other spondylosis, lumbar region: Secondary | ICD-10-CM | POA: Diagnosis not present

## 2019-07-11 DIAGNOSIS — M533 Sacrococcygeal disorders, not elsewhere classified: Secondary | ICD-10-CM | POA: Diagnosis not present

## 2019-07-11 DIAGNOSIS — M4696 Unspecified inflammatory spondylopathy, lumbar region: Secondary | ICD-10-CM | POA: Diagnosis not present

## 2019-07-11 DIAGNOSIS — R52 Pain, unspecified: Secondary | ICD-10-CM | POA: Diagnosis not present

## 2019-07-11 DIAGNOSIS — G894 Chronic pain syndrome: Secondary | ICD-10-CM | POA: Diagnosis not present

## 2019-07-11 DIAGNOSIS — Z79891 Long term (current) use of opiate analgesic: Secondary | ICD-10-CM | POA: Diagnosis not present

## 2019-07-11 DIAGNOSIS — M25512 Pain in left shoulder: Secondary | ICD-10-CM | POA: Diagnosis not present

## 2019-07-11 DIAGNOSIS — K5903 Drug induced constipation: Secondary | ICD-10-CM | POA: Diagnosis not present

## 2019-07-11 DIAGNOSIS — M25511 Pain in right shoulder: Secondary | ICD-10-CM | POA: Diagnosis not present

## 2019-10-03 DIAGNOSIS — Z79891 Long term (current) use of opiate analgesic: Secondary | ICD-10-CM | POA: Diagnosis not present

## 2019-10-03 DIAGNOSIS — G894 Chronic pain syndrome: Secondary | ICD-10-CM | POA: Diagnosis not present

## 2019-10-03 DIAGNOSIS — K5903 Drug induced constipation: Secondary | ICD-10-CM | POA: Diagnosis not present

## 2019-10-03 DIAGNOSIS — M533 Sacrococcygeal disorders, not elsewhere classified: Secondary | ICD-10-CM | POA: Diagnosis not present

## 2019-10-03 DIAGNOSIS — R52 Pain, unspecified: Secondary | ICD-10-CM | POA: Diagnosis not present

## 2019-10-03 DIAGNOSIS — M47896 Other spondylosis, lumbar region: Secondary | ICD-10-CM | POA: Diagnosis not present

## 2019-10-03 DIAGNOSIS — M25512 Pain in left shoulder: Secondary | ICD-10-CM | POA: Diagnosis not present

## 2019-10-03 DIAGNOSIS — M4696 Unspecified inflammatory spondylopathy, lumbar region: Secondary | ICD-10-CM | POA: Diagnosis not present

## 2019-10-03 DIAGNOSIS — M25511 Pain in right shoulder: Secondary | ICD-10-CM | POA: Diagnosis not present

## 2019-10-24 ENCOUNTER — Other Ambulatory Visit: Payer: Self-pay | Admitting: Internal Medicine

## 2019-10-24 DIAGNOSIS — Z1231 Encounter for screening mammogram for malignant neoplasm of breast: Secondary | ICD-10-CM

## 2019-10-29 DIAGNOSIS — I10 Essential (primary) hypertension: Secondary | ICD-10-CM | POA: Diagnosis not present

## 2019-11-05 DIAGNOSIS — I10 Essential (primary) hypertension: Secondary | ICD-10-CM | POA: Diagnosis not present

## 2019-11-05 DIAGNOSIS — E559 Vitamin D deficiency, unspecified: Secondary | ICD-10-CM | POA: Diagnosis not present

## 2019-11-05 DIAGNOSIS — Z1389 Encounter for screening for other disorder: Secondary | ICD-10-CM | POA: Diagnosis not present

## 2019-11-05 DIAGNOSIS — Z0000 Encounter for general adult medical examination without abnormal findings: Secondary | ICD-10-CM | POA: Diagnosis not present

## 2019-11-05 DIAGNOSIS — E7800 Pure hypercholesterolemia, unspecified: Secondary | ICD-10-CM | POA: Diagnosis not present

## 2019-11-05 DIAGNOSIS — K219 Gastro-esophageal reflux disease without esophagitis: Secondary | ICD-10-CM | POA: Diagnosis not present

## 2019-11-28 ENCOUNTER — Ambulatory Visit
Admission: RE | Admit: 2019-11-28 | Discharge: 2019-11-28 | Disposition: A | Discharge status: Home / Self Care | Payer: PPO | Source: Ambulatory Visit | Attending: Internal Medicine | Admitting: Internal Medicine

## 2019-11-28 DIAGNOSIS — Z1231 Encounter for screening mammogram for malignant neoplasm of breast: Secondary | ICD-10-CM | POA: Diagnosis not present

## 2019-12-21 DIAGNOSIS — H903 Sensorineural hearing loss, bilateral: Secondary | ICD-10-CM | POA: Diagnosis not present

## 2019-12-21 DIAGNOSIS — H6983 Other specified disorders of Eustachian tube, bilateral: Secondary | ICD-10-CM | POA: Diagnosis not present

## 2019-12-21 DIAGNOSIS — H9312 Tinnitus, left ear: Secondary | ICD-10-CM | POA: Diagnosis not present

## 2019-12-26 DIAGNOSIS — M25511 Pain in right shoulder: Secondary | ICD-10-CM | POA: Diagnosis not present

## 2019-12-26 DIAGNOSIS — M47896 Other spondylosis, lumbar region: Secondary | ICD-10-CM | POA: Diagnosis not present

## 2019-12-26 DIAGNOSIS — K5903 Drug induced constipation: Secondary | ICD-10-CM | POA: Diagnosis not present

## 2019-12-26 DIAGNOSIS — R52 Pain, unspecified: Secondary | ICD-10-CM | POA: Diagnosis not present

## 2019-12-26 DIAGNOSIS — Z79899 Other long term (current) drug therapy: Secondary | ICD-10-CM | POA: Diagnosis not present

## 2019-12-26 DIAGNOSIS — M4696 Unspecified inflammatory spondylopathy, lumbar region: Secondary | ICD-10-CM | POA: Diagnosis not present

## 2019-12-26 DIAGNOSIS — M25512 Pain in left shoulder: Secondary | ICD-10-CM | POA: Diagnosis not present

## 2019-12-26 DIAGNOSIS — G894 Chronic pain syndrome: Secondary | ICD-10-CM | POA: Diagnosis not present

## 2019-12-26 DIAGNOSIS — Z79891 Long term (current) use of opiate analgesic: Secondary | ICD-10-CM | POA: Diagnosis not present

## 2019-12-26 DIAGNOSIS — M533 Sacrococcygeal disorders, not elsewhere classified: Secondary | ICD-10-CM | POA: Diagnosis not present

## 2019-12-26 DIAGNOSIS — Z5181 Encounter for therapeutic drug level monitoring: Secondary | ICD-10-CM | POA: Diagnosis not present

## 2020-01-08 DIAGNOSIS — L578 Other skin changes due to chronic exposure to nonionizing radiation: Secondary | ICD-10-CM | POA: Diagnosis not present

## 2020-01-08 DIAGNOSIS — L821 Other seborrheic keratosis: Secondary | ICD-10-CM | POA: Diagnosis not present

## 2020-01-08 DIAGNOSIS — L570 Actinic keratosis: Secondary | ICD-10-CM | POA: Diagnosis not present

## 2020-01-08 DIAGNOSIS — Z872 Personal history of diseases of the skin and subcutaneous tissue: Secondary | ICD-10-CM | POA: Diagnosis not present

## 2020-01-18 DIAGNOSIS — Z961 Presence of intraocular lens: Secondary | ICD-10-CM | POA: Diagnosis not present

## 2020-02-13 DIAGNOSIS — M25511 Pain in right shoulder: Secondary | ICD-10-CM | POA: Diagnosis not present

## 2020-02-13 DIAGNOSIS — K5903 Drug induced constipation: Secondary | ICD-10-CM | POA: Diagnosis not present

## 2020-02-13 DIAGNOSIS — R52 Pain, unspecified: Secondary | ICD-10-CM | POA: Diagnosis not present

## 2020-02-13 DIAGNOSIS — M533 Sacrococcygeal disorders, not elsewhere classified: Secondary | ICD-10-CM | POA: Diagnosis not present

## 2020-02-13 DIAGNOSIS — M25512 Pain in left shoulder: Secondary | ICD-10-CM | POA: Diagnosis not present

## 2020-02-13 DIAGNOSIS — M47896 Other spondylosis, lumbar region: Secondary | ICD-10-CM | POA: Diagnosis not present

## 2020-02-13 DIAGNOSIS — G894 Chronic pain syndrome: Secondary | ICD-10-CM | POA: Diagnosis not present

## 2020-02-13 DIAGNOSIS — M4696 Unspecified inflammatory spondylopathy, lumbar region: Secondary | ICD-10-CM | POA: Diagnosis not present

## 2020-02-13 DIAGNOSIS — Z79891 Long term (current) use of opiate analgesic: Secondary | ICD-10-CM | POA: Diagnosis not present

## 2020-04-04 DIAGNOSIS — Z23 Encounter for immunization: Secondary | ICD-10-CM | POA: Diagnosis not present

## 2020-05-01 DIAGNOSIS — I10 Essential (primary) hypertension: Secondary | ICD-10-CM | POA: Diagnosis not present

## 2020-05-01 DIAGNOSIS — R8290 Unspecified abnormal findings in urine: Secondary | ICD-10-CM | POA: Diagnosis not present

## 2020-05-01 DIAGNOSIS — E7800 Pure hypercholesterolemia, unspecified: Secondary | ICD-10-CM | POA: Diagnosis not present

## 2020-05-08 DIAGNOSIS — E559 Vitamin D deficiency, unspecified: Secondary | ICD-10-CM | POA: Diagnosis not present

## 2020-05-08 DIAGNOSIS — I10 Essential (primary) hypertension: Secondary | ICD-10-CM | POA: Diagnosis not present

## 2020-05-08 DIAGNOSIS — Z0001 Encounter for general adult medical examination with abnormal findings: Secondary | ICD-10-CM | POA: Diagnosis not present

## 2020-05-08 DIAGNOSIS — E7800 Pure hypercholesterolemia, unspecified: Secondary | ICD-10-CM | POA: Diagnosis not present

## 2020-05-08 DIAGNOSIS — K219 Gastro-esophageal reflux disease without esophagitis: Secondary | ICD-10-CM | POA: Diagnosis not present

## 2020-05-14 ENCOUNTER — Other Ambulatory Visit: Payer: Self-pay | Admitting: Pain Medicine

## 2020-05-14 ENCOUNTER — Ambulatory Visit
Admission: RE | Admit: 2020-05-14 | Discharge: 2020-05-14 | Disposition: A | Discharge status: Home / Self Care | Payer: PPO | Source: Ambulatory Visit | Attending: Pain Medicine | Admitting: Pain Medicine

## 2020-05-14 ENCOUNTER — Other Ambulatory Visit (HOSPITAL_COMMUNITY): Payer: Self-pay | Admitting: Pain Medicine

## 2020-05-14 ENCOUNTER — Other Ambulatory Visit: Payer: Self-pay

## 2020-05-14 DIAGNOSIS — Z79891 Long term (current) use of opiate analgesic: Secondary | ICD-10-CM | POA: Diagnosis not present

## 2020-05-14 DIAGNOSIS — M533 Sacrococcygeal disorders, not elsewhere classified: Secondary | ICD-10-CM | POA: Diagnosis not present

## 2020-05-14 DIAGNOSIS — M79604 Pain in right leg: Secondary | ICD-10-CM | POA: Diagnosis not present

## 2020-05-14 DIAGNOSIS — G894 Chronic pain syndrome: Secondary | ICD-10-CM | POA: Diagnosis not present

## 2020-05-14 DIAGNOSIS — M25512 Pain in left shoulder: Secondary | ICD-10-CM | POA: Diagnosis not present

## 2020-05-14 DIAGNOSIS — R52 Pain, unspecified: Secondary | ICD-10-CM | POA: Diagnosis not present

## 2020-05-14 DIAGNOSIS — M47896 Other spondylosis, lumbar region: Secondary | ICD-10-CM | POA: Diagnosis not present

## 2020-05-14 DIAGNOSIS — R2240 Localized swelling, mass and lump, unspecified lower limb: Secondary | ICD-10-CM | POA: Diagnosis not present

## 2020-05-14 DIAGNOSIS — K5903 Drug induced constipation: Secondary | ICD-10-CM | POA: Diagnosis not present

## 2020-05-14 DIAGNOSIS — R2241 Localized swelling, mass and lump, right lower limb: Secondary | ICD-10-CM | POA: Diagnosis not present

## 2020-05-14 DIAGNOSIS — M25511 Pain in right shoulder: Secondary | ICD-10-CM | POA: Diagnosis not present

## 2020-05-14 DIAGNOSIS — M79661 Pain in right lower leg: Secondary | ICD-10-CM | POA: Diagnosis not present

## 2020-05-14 DIAGNOSIS — M4696 Unspecified inflammatory spondylopathy, lumbar region: Secondary | ICD-10-CM | POA: Diagnosis not present

## 2020-05-22 DIAGNOSIS — R2241 Localized swelling, mass and lump, right lower limb: Secondary | ICD-10-CM | POA: Diagnosis not present

## 2020-05-27 DIAGNOSIS — M1711 Unilateral primary osteoarthritis, right knee: Secondary | ICD-10-CM | POA: Diagnosis not present

## 2020-05-29 ENCOUNTER — Other Ambulatory Visit: Payer: Self-pay | Admitting: Internal Medicine

## 2020-05-29 DIAGNOSIS — R2241 Localized swelling, mass and lump, right lower limb: Secondary | ICD-10-CM

## 2020-06-08 ENCOUNTER — Ambulatory Visit
Admission: RE | Admit: 2020-06-08 | Discharge: 2020-06-08 | Disposition: A | Discharge status: Home / Self Care | Payer: PPO | Source: Ambulatory Visit | Attending: Internal Medicine | Admitting: Internal Medicine

## 2020-06-08 ENCOUNTER — Other Ambulatory Visit: Payer: Self-pay

## 2020-06-08 DIAGNOSIS — R2241 Localized swelling, mass and lump, right lower limb: Secondary | ICD-10-CM | POA: Diagnosis not present

## 2020-06-08 DIAGNOSIS — S76311A Strain of muscle, fascia and tendon of the posterior muscle group at thigh level, right thigh, initial encounter: Secondary | ICD-10-CM | POA: Diagnosis not present

## 2020-06-08 MED ORDER — GADOBUTROL 1 MMOL/ML IV SOLN
6.0000 mL | Freq: Once | INTRAVENOUS | Status: AC | PRN
  Administered 2020-06-08: 6 mL via INTRAVENOUS

## 2020-07-09 DIAGNOSIS — G894 Chronic pain syndrome: Secondary | ICD-10-CM | POA: Diagnosis not present

## 2020-07-09 DIAGNOSIS — M4696 Unspecified inflammatory spondylopathy, lumbar region: Secondary | ICD-10-CM | POA: Diagnosis not present

## 2020-07-09 DIAGNOSIS — M47896 Other spondylosis, lumbar region: Secondary | ICD-10-CM | POA: Diagnosis not present

## 2020-07-09 DIAGNOSIS — R52 Pain, unspecified: Secondary | ICD-10-CM | POA: Diagnosis not present

## 2020-07-09 DIAGNOSIS — M533 Sacrococcygeal disorders, not elsewhere classified: Secondary | ICD-10-CM | POA: Diagnosis not present

## 2020-07-09 DIAGNOSIS — R2240 Localized swelling, mass and lump, unspecified lower limb: Secondary | ICD-10-CM | POA: Diagnosis not present

## 2020-07-09 DIAGNOSIS — Z79891 Long term (current) use of opiate analgesic: Secondary | ICD-10-CM | POA: Diagnosis not present

## 2020-07-09 DIAGNOSIS — K5903 Drug induced constipation: Secondary | ICD-10-CM | POA: Diagnosis not present

## 2020-07-09 DIAGNOSIS — M25512 Pain in left shoulder: Secondary | ICD-10-CM | POA: Diagnosis not present

## 2020-07-09 DIAGNOSIS — M25511 Pain in right shoulder: Secondary | ICD-10-CM | POA: Diagnosis not present

## 2020-08-25 DIAGNOSIS — M1711 Unilateral primary osteoarthritis, right knee: Secondary | ICD-10-CM | POA: Diagnosis not present

## 2020-10-08 DIAGNOSIS — M47896 Other spondylosis, lumbar region: Secondary | ICD-10-CM | POA: Diagnosis not present

## 2020-10-08 DIAGNOSIS — R2240 Localized swelling, mass and lump, unspecified lower limb: Secondary | ICD-10-CM | POA: Diagnosis not present

## 2020-10-08 DIAGNOSIS — M25511 Pain in right shoulder: Secondary | ICD-10-CM | POA: Diagnosis not present

## 2020-10-08 DIAGNOSIS — M4696 Unspecified inflammatory spondylopathy, lumbar region: Secondary | ICD-10-CM | POA: Diagnosis not present

## 2020-10-08 DIAGNOSIS — R52 Pain, unspecified: Secondary | ICD-10-CM | POA: Diagnosis not present

## 2020-10-08 DIAGNOSIS — K5903 Drug induced constipation: Secondary | ICD-10-CM | POA: Diagnosis not present

## 2020-10-08 DIAGNOSIS — G894 Chronic pain syndrome: Secondary | ICD-10-CM | POA: Diagnosis not present

## 2020-10-08 DIAGNOSIS — M533 Sacrococcygeal disorders, not elsewhere classified: Secondary | ICD-10-CM | POA: Diagnosis not present

## 2020-10-08 DIAGNOSIS — Z5181 Encounter for therapeutic drug level monitoring: Secondary | ICD-10-CM | POA: Diagnosis not present

## 2020-10-08 DIAGNOSIS — M25512 Pain in left shoulder: Secondary | ICD-10-CM | POA: Diagnosis not present

## 2020-10-08 DIAGNOSIS — Z79891 Long term (current) use of opiate analgesic: Secondary | ICD-10-CM | POA: Diagnosis not present

## 2020-10-20 ENCOUNTER — Other Ambulatory Visit: Payer: Self-pay | Admitting: Internal Medicine

## 2020-10-20 DIAGNOSIS — Z1231 Encounter for screening mammogram for malignant neoplasm of breast: Secondary | ICD-10-CM

## 2020-10-22 DIAGNOSIS — R040 Epistaxis: Secondary | ICD-10-CM | POA: Diagnosis not present

## 2020-10-29 DIAGNOSIS — I10 Essential (primary) hypertension: Secondary | ICD-10-CM | POA: Diagnosis not present

## 2020-11-05 DIAGNOSIS — E7800 Pure hypercholesterolemia, unspecified: Secondary | ICD-10-CM | POA: Diagnosis not present

## 2020-11-05 DIAGNOSIS — I83893 Varicose veins of bilateral lower extremities with other complications: Secondary | ICD-10-CM | POA: Diagnosis not present

## 2020-11-05 DIAGNOSIS — Z0000 Encounter for general adult medical examination without abnormal findings: Secondary | ICD-10-CM | POA: Diagnosis not present

## 2020-11-05 DIAGNOSIS — I10 Essential (primary) hypertension: Secondary | ICD-10-CM | POA: Diagnosis not present

## 2020-11-05 DIAGNOSIS — K219 Gastro-esophageal reflux disease without esophagitis: Secondary | ICD-10-CM | POA: Diagnosis not present

## 2020-11-05 DIAGNOSIS — E559 Vitamin D deficiency, unspecified: Secondary | ICD-10-CM | POA: Diagnosis not present

## 2020-11-28 ENCOUNTER — Ambulatory Visit
Admission: RE | Admit: 2020-11-28 | Discharge: 2020-11-28 | Disposition: A | Discharge status: Home / Self Care | Payer: PPO | Source: Ambulatory Visit | Attending: Internal Medicine | Admitting: Internal Medicine

## 2020-11-28 ENCOUNTER — Other Ambulatory Visit: Payer: Self-pay

## 2020-11-28 DIAGNOSIS — Z1231 Encounter for screening mammogram for malignant neoplasm of breast: Secondary | ICD-10-CM | POA: Insufficient documentation

## 2021-01-07 DIAGNOSIS — M25511 Pain in right shoulder: Secondary | ICD-10-CM | POA: Diagnosis not present

## 2021-01-07 DIAGNOSIS — Z79891 Long term (current) use of opiate analgesic: Secondary | ICD-10-CM | POA: Diagnosis not present

## 2021-01-07 DIAGNOSIS — R52 Pain, unspecified: Secondary | ICD-10-CM | POA: Diagnosis not present

## 2021-01-07 DIAGNOSIS — R2240 Localized swelling, mass and lump, unspecified lower limb: Secondary | ICD-10-CM | POA: Diagnosis not present

## 2021-01-07 DIAGNOSIS — K5903 Drug induced constipation: Secondary | ICD-10-CM | POA: Diagnosis not present

## 2021-01-07 DIAGNOSIS — M4696 Unspecified inflammatory spondylopathy, lumbar region: Secondary | ICD-10-CM | POA: Diagnosis not present

## 2021-01-07 DIAGNOSIS — M533 Sacrococcygeal disorders, not elsewhere classified: Secondary | ICD-10-CM | POA: Diagnosis not present

## 2021-01-07 DIAGNOSIS — G894 Chronic pain syndrome: Secondary | ICD-10-CM | POA: Diagnosis not present

## 2021-01-07 DIAGNOSIS — M25512 Pain in left shoulder: Secondary | ICD-10-CM | POA: Diagnosis not present

## 2021-01-07 DIAGNOSIS — M47896 Other spondylosis, lumbar region: Secondary | ICD-10-CM | POA: Diagnosis not present

## 2021-01-19 DIAGNOSIS — H26491 Other secondary cataract, right eye: Secondary | ICD-10-CM | POA: Diagnosis not present

## 2021-01-23 DIAGNOSIS — M1711 Unilateral primary osteoarthritis, right knee: Secondary | ICD-10-CM | POA: Diagnosis not present

## 2021-03-31 DIAGNOSIS — Z23 Encounter for immunization: Secondary | ICD-10-CM | POA: Diagnosis not present

## 2021-04-08 DIAGNOSIS — M4696 Unspecified inflammatory spondylopathy, lumbar region: Secondary | ICD-10-CM | POA: Diagnosis not present

## 2021-04-08 DIAGNOSIS — K5903 Drug induced constipation: Secondary | ICD-10-CM | POA: Diagnosis not present

## 2021-04-08 DIAGNOSIS — M47896 Other spondylosis, lumbar region: Secondary | ICD-10-CM | POA: Diagnosis not present

## 2021-04-08 DIAGNOSIS — M25511 Pain in right shoulder: Secondary | ICD-10-CM | POA: Diagnosis not present

## 2021-04-08 DIAGNOSIS — R52 Pain, unspecified: Secondary | ICD-10-CM | POA: Diagnosis not present

## 2021-04-08 DIAGNOSIS — Z79891 Long term (current) use of opiate analgesic: Secondary | ICD-10-CM | POA: Diagnosis not present

## 2021-04-08 DIAGNOSIS — G894 Chronic pain syndrome: Secondary | ICD-10-CM | POA: Diagnosis not present

## 2021-04-08 DIAGNOSIS — R2240 Localized swelling, mass and lump, unspecified lower limb: Secondary | ICD-10-CM | POA: Diagnosis not present

## 2021-04-08 DIAGNOSIS — M25512 Pain in left shoulder: Secondary | ICD-10-CM | POA: Diagnosis not present

## 2021-04-08 DIAGNOSIS — M533 Sacrococcygeal disorders, not elsewhere classified: Secondary | ICD-10-CM | POA: Diagnosis not present

## 2021-04-17 DIAGNOSIS — U071 COVID-19: Secondary | ICD-10-CM | POA: Diagnosis not present

## 2021-04-17 DIAGNOSIS — R051 Acute cough: Secondary | ICD-10-CM | POA: Diagnosis not present

## 2021-05-05 DIAGNOSIS — E7800 Pure hypercholesterolemia, unspecified: Secondary | ICD-10-CM | POA: Diagnosis not present

## 2021-05-12 DIAGNOSIS — Z0001 Encounter for general adult medical examination with abnormal findings: Secondary | ICD-10-CM | POA: Diagnosis not present

## 2021-05-12 DIAGNOSIS — G5602 Carpal tunnel syndrome, left upper limb: Secondary | ICD-10-CM | POA: Diagnosis not present

## 2021-05-12 DIAGNOSIS — K219 Gastro-esophageal reflux disease without esophagitis: Secondary | ICD-10-CM | POA: Diagnosis not present

## 2021-05-12 DIAGNOSIS — E559 Vitamin D deficiency, unspecified: Secondary | ICD-10-CM | POA: Diagnosis not present

## 2021-05-12 DIAGNOSIS — I10 Essential (primary) hypertension: Secondary | ICD-10-CM | POA: Diagnosis not present

## 2021-05-12 DIAGNOSIS — E7800 Pure hypercholesterolemia, unspecified: Secondary | ICD-10-CM | POA: Diagnosis not present

## 2021-05-25 DIAGNOSIS — Z872 Personal history of diseases of the skin and subcutaneous tissue: Secondary | ICD-10-CM | POA: Diagnosis not present

## 2021-05-25 DIAGNOSIS — L570 Actinic keratosis: Secondary | ICD-10-CM | POA: Diagnosis not present

## 2021-05-25 DIAGNOSIS — L578 Other skin changes due to chronic exposure to nonionizing radiation: Secondary | ICD-10-CM | POA: Diagnosis not present

## 2021-06-02 DIAGNOSIS — M1711 Unilateral primary osteoarthritis, right knee: Secondary | ICD-10-CM | POA: Diagnosis not present

## 2021-07-08 DIAGNOSIS — R52 Pain, unspecified: Secondary | ICD-10-CM | POA: Diagnosis not present

## 2021-07-08 DIAGNOSIS — M25512 Pain in left shoulder: Secondary | ICD-10-CM | POA: Diagnosis not present

## 2021-07-08 DIAGNOSIS — R2240 Localized swelling, mass and lump, unspecified lower limb: Secondary | ICD-10-CM | POA: Diagnosis not present

## 2021-07-08 DIAGNOSIS — M25511 Pain in right shoulder: Secondary | ICD-10-CM | POA: Diagnosis not present

## 2021-07-08 DIAGNOSIS — M47896 Other spondylosis, lumbar region: Secondary | ICD-10-CM | POA: Diagnosis not present

## 2021-07-08 DIAGNOSIS — K5903 Drug induced constipation: Secondary | ICD-10-CM | POA: Diagnosis not present

## 2021-07-08 DIAGNOSIS — M4696 Unspecified inflammatory spondylopathy, lumbar region: Secondary | ICD-10-CM | POA: Diagnosis not present

## 2021-07-08 DIAGNOSIS — G894 Chronic pain syndrome: Secondary | ICD-10-CM | POA: Diagnosis not present

## 2021-07-08 DIAGNOSIS — Z79891 Long term (current) use of opiate analgesic: Secondary | ICD-10-CM | POA: Diagnosis not present

## 2021-07-08 DIAGNOSIS — M533 Sacrococcygeal disorders, not elsewhere classified: Secondary | ICD-10-CM | POA: Diagnosis not present

## 2021-10-22 ENCOUNTER — Other Ambulatory Visit: Payer: Self-pay | Admitting: Internal Medicine

## 2021-10-22 DIAGNOSIS — Z1231 Encounter for screening mammogram for malignant neoplasm of breast: Secondary | ICD-10-CM

## 2021-10-28 DIAGNOSIS — G894 Chronic pain syndrome: Secondary | ICD-10-CM | POA: Diagnosis not present

## 2021-10-28 DIAGNOSIS — M791 Myalgia, unspecified site: Secondary | ICD-10-CM | POA: Diagnosis not present

## 2021-10-28 DIAGNOSIS — M25512 Pain in left shoulder: Secondary | ICD-10-CM | POA: Diagnosis not present

## 2021-10-28 DIAGNOSIS — Z79891 Long term (current) use of opiate analgesic: Secondary | ICD-10-CM | POA: Diagnosis not present

## 2021-10-28 DIAGNOSIS — M25511 Pain in right shoulder: Secondary | ICD-10-CM | POA: Diagnosis not present

## 2021-10-28 DIAGNOSIS — M47896 Other spondylosis, lumbar region: Secondary | ICD-10-CM | POA: Diagnosis not present

## 2021-10-28 DIAGNOSIS — M533 Sacrococcygeal disorders, not elsewhere classified: Secondary | ICD-10-CM | POA: Diagnosis not present

## 2021-10-28 DIAGNOSIS — R2240 Localized swelling, mass and lump, unspecified lower limb: Secondary | ICD-10-CM | POA: Diagnosis not present

## 2021-10-28 DIAGNOSIS — K5903 Drug induced constipation: Secondary | ICD-10-CM | POA: Diagnosis not present

## 2021-10-28 DIAGNOSIS — R52 Pain, unspecified: Secondary | ICD-10-CM | POA: Diagnosis not present

## 2021-10-28 DIAGNOSIS — M4696 Unspecified inflammatory spondylopathy, lumbar region: Secondary | ICD-10-CM | POA: Diagnosis not present

## 2021-11-03 DIAGNOSIS — I10 Essential (primary) hypertension: Secondary | ICD-10-CM | POA: Diagnosis not present

## 2021-11-03 DIAGNOSIS — M1711 Unilateral primary osteoarthritis, right knee: Secondary | ICD-10-CM | POA: Diagnosis not present

## 2021-11-10 DIAGNOSIS — I10 Essential (primary) hypertension: Secondary | ICD-10-CM | POA: Diagnosis not present

## 2021-11-10 DIAGNOSIS — Z0000 Encounter for general adult medical examination without abnormal findings: Secondary | ICD-10-CM | POA: Diagnosis not present

## 2021-11-10 DIAGNOSIS — E7800 Pure hypercholesterolemia, unspecified: Secondary | ICD-10-CM | POA: Diagnosis not present

## 2021-11-10 DIAGNOSIS — E278 Other specified disorders of adrenal gland: Secondary | ICD-10-CM | POA: Diagnosis not present

## 2021-11-10 DIAGNOSIS — E559 Vitamin D deficiency, unspecified: Secondary | ICD-10-CM | POA: Diagnosis not present

## 2021-11-10 DIAGNOSIS — R079 Chest pain, unspecified: Secondary | ICD-10-CM | POA: Diagnosis not present

## 2021-11-10 DIAGNOSIS — K219 Gastro-esophageal reflux disease without esophagitis: Secondary | ICD-10-CM | POA: Diagnosis not present

## 2021-11-11 DIAGNOSIS — E7800 Pure hypercholesterolemia, unspecified: Secondary | ICD-10-CM | POA: Diagnosis not present

## 2021-11-11 DIAGNOSIS — R0602 Shortness of breath: Secondary | ICD-10-CM | POA: Diagnosis not present

## 2021-11-11 DIAGNOSIS — I10 Essential (primary) hypertension: Secondary | ICD-10-CM | POA: Diagnosis not present

## 2021-11-11 DIAGNOSIS — I208 Other forms of angina pectoris: Secondary | ICD-10-CM | POA: Diagnosis not present

## 2021-11-30 ENCOUNTER — Ambulatory Visit
Admission: RE | Admit: 2021-11-30 | Discharge: 2021-11-30 | Disposition: A | Discharge status: Home / Self Care | Payer: PPO | Source: Ambulatory Visit | Attending: Internal Medicine | Admitting: Internal Medicine

## 2021-11-30 DIAGNOSIS — Z1231 Encounter for screening mammogram for malignant neoplasm of breast: Secondary | ICD-10-CM | POA: Insufficient documentation

## 2021-12-03 DIAGNOSIS — I208 Other forms of angina pectoris: Secondary | ICD-10-CM | POA: Diagnosis not present

## 2021-12-03 DIAGNOSIS — R0602 Shortness of breath: Secondary | ICD-10-CM | POA: Diagnosis not present

## 2021-12-10 DIAGNOSIS — I700 Atherosclerosis of aorta: Secondary | ICD-10-CM | POA: Diagnosis not present

## 2021-12-10 DIAGNOSIS — E7800 Pure hypercholesterolemia, unspecified: Secondary | ICD-10-CM | POA: Diagnosis not present

## 2021-12-10 DIAGNOSIS — I10 Essential (primary) hypertension: Secondary | ICD-10-CM | POA: Diagnosis not present

## 2022-01-21 DIAGNOSIS — H43813 Vitreous degeneration, bilateral: Secondary | ICD-10-CM | POA: Diagnosis not present

## 2022-01-28 DIAGNOSIS — M47896 Other spondylosis, lumbar region: Secondary | ICD-10-CM | POA: Diagnosis not present

## 2022-01-28 DIAGNOSIS — K5903 Drug induced constipation: Secondary | ICD-10-CM | POA: Diagnosis not present

## 2022-01-28 DIAGNOSIS — G894 Chronic pain syndrome: Secondary | ICD-10-CM | POA: Diagnosis not present

## 2022-01-28 DIAGNOSIS — Z79891 Long term (current) use of opiate analgesic: Secondary | ICD-10-CM | POA: Diagnosis not present

## 2022-01-28 DIAGNOSIS — M4696 Unspecified inflammatory spondylopathy, lumbar region: Secondary | ICD-10-CM | POA: Diagnosis not present

## 2022-01-28 DIAGNOSIS — R2240 Localized swelling, mass and lump, unspecified lower limb: Secondary | ICD-10-CM | POA: Diagnosis not present

## 2022-01-28 DIAGNOSIS — M791 Myalgia, unspecified site: Secondary | ICD-10-CM | POA: Diagnosis not present

## 2022-01-28 DIAGNOSIS — M25511 Pain in right shoulder: Secondary | ICD-10-CM | POA: Diagnosis not present

## 2022-01-28 DIAGNOSIS — M19019 Primary osteoarthritis, unspecified shoulder: Secondary | ICD-10-CM | POA: Diagnosis not present

## 2022-01-28 DIAGNOSIS — M25512 Pain in left shoulder: Secondary | ICD-10-CM | POA: Diagnosis not present

## 2022-01-28 DIAGNOSIS — R52 Pain, unspecified: Secondary | ICD-10-CM | POA: Diagnosis not present

## 2022-02-01 DIAGNOSIS — M1711 Unilateral primary osteoarthritis, right knee: Secondary | ICD-10-CM | POA: Diagnosis not present

## 2022-03-15 DIAGNOSIS — M19012 Primary osteoarthritis, left shoulder: Secondary | ICD-10-CM | POA: Diagnosis not present

## 2022-03-15 DIAGNOSIS — M1711 Unilateral primary osteoarthritis, right knee: Secondary | ICD-10-CM | POA: Diagnosis not present

## 2022-04-12 DIAGNOSIS — H0012 Chalazion right lower eyelid: Secondary | ICD-10-CM | POA: Diagnosis not present

## 2022-04-15 DIAGNOSIS — I10 Essential (primary) hypertension: Secondary | ICD-10-CM | POA: Diagnosis not present

## 2022-04-15 DIAGNOSIS — I700 Atherosclerosis of aorta: Secondary | ICD-10-CM | POA: Diagnosis not present

## 2022-04-15 DIAGNOSIS — E7800 Pure hypercholesterolemia, unspecified: Secondary | ICD-10-CM | POA: Diagnosis not present

## 2022-04-28 DIAGNOSIS — Z79891 Long term (current) use of opiate analgesic: Secondary | ICD-10-CM | POA: Diagnosis not present

## 2022-04-28 DIAGNOSIS — M25512 Pain in left shoulder: Secondary | ICD-10-CM | POA: Diagnosis not present

## 2022-04-28 DIAGNOSIS — M19019 Primary osteoarthritis, unspecified shoulder: Secondary | ICD-10-CM | POA: Diagnosis not present

## 2022-04-28 DIAGNOSIS — M47896 Other spondylosis, lumbar region: Secondary | ICD-10-CM | POA: Diagnosis not present

## 2022-04-28 DIAGNOSIS — M4696 Unspecified inflammatory spondylopathy, lumbar region: Secondary | ICD-10-CM | POA: Diagnosis not present

## 2022-04-28 DIAGNOSIS — G894 Chronic pain syndrome: Secondary | ICD-10-CM | POA: Diagnosis not present

## 2022-04-28 DIAGNOSIS — R52 Pain, unspecified: Secondary | ICD-10-CM | POA: Diagnosis not present

## 2022-04-28 DIAGNOSIS — M25511 Pain in right shoulder: Secondary | ICD-10-CM | POA: Diagnosis not present

## 2022-05-05 DIAGNOSIS — M7542 Impingement syndrome of left shoulder: Secondary | ICD-10-CM | POA: Diagnosis not present

## 2022-05-12 DIAGNOSIS — I10 Essential (primary) hypertension: Secondary | ICD-10-CM | POA: Diagnosis not present

## 2022-05-20 DIAGNOSIS — G5601 Carpal tunnel syndrome, right upper limb: Secondary | ICD-10-CM | POA: Diagnosis not present

## 2022-05-20 DIAGNOSIS — I10 Essential (primary) hypertension: Secondary | ICD-10-CM | POA: Diagnosis not present

## 2022-05-20 DIAGNOSIS — Z0001 Encounter for general adult medical examination with abnormal findings: Secondary | ICD-10-CM | POA: Diagnosis not present

## 2022-05-20 DIAGNOSIS — E559 Vitamin D deficiency, unspecified: Secondary | ICD-10-CM | POA: Diagnosis not present

## 2022-05-20 DIAGNOSIS — E7800 Pure hypercholesterolemia, unspecified: Secondary | ICD-10-CM | POA: Diagnosis not present

## 2022-05-20 DIAGNOSIS — K219 Gastro-esophageal reflux disease without esophagitis: Secondary | ICD-10-CM | POA: Diagnosis not present

## 2022-07-07 DIAGNOSIS — G894 Chronic pain syndrome: Secondary | ICD-10-CM | POA: Diagnosis not present

## 2022-07-07 DIAGNOSIS — R52 Pain, unspecified: Secondary | ICD-10-CM | POA: Diagnosis not present

## 2022-07-07 DIAGNOSIS — M19019 Primary osteoarthritis, unspecified shoulder: Secondary | ICD-10-CM | POA: Diagnosis not present

## 2022-07-07 DIAGNOSIS — M25512 Pain in left shoulder: Secondary | ICD-10-CM | POA: Diagnosis not present

## 2022-07-07 DIAGNOSIS — M25511 Pain in right shoulder: Secondary | ICD-10-CM | POA: Diagnosis not present

## 2022-07-07 DIAGNOSIS — M4696 Unspecified inflammatory spondylopathy, lumbar region: Secondary | ICD-10-CM | POA: Diagnosis not present

## 2022-07-07 DIAGNOSIS — Z79891 Long term (current) use of opiate analgesic: Secondary | ICD-10-CM | POA: Diagnosis not present

## 2022-07-07 DIAGNOSIS — M47896 Other spondylosis, lumbar region: Secondary | ICD-10-CM | POA: Diagnosis not present

## 2022-08-06 DIAGNOSIS — M7542 Impingement syndrome of left shoulder: Secondary | ICD-10-CM | POA: Diagnosis not present

## 2022-08-30 DIAGNOSIS — G5603 Carpal tunnel syndrome, bilateral upper limbs: Secondary | ICD-10-CM | POA: Diagnosis not present

## 2022-08-30 DIAGNOSIS — M67432 Ganglion, left wrist: Secondary | ICD-10-CM | POA: Diagnosis not present

## 2022-09-16 DIAGNOSIS — G5603 Carpal tunnel syndrome, bilateral upper limbs: Secondary | ICD-10-CM | POA: Diagnosis not present

## 2022-09-16 DIAGNOSIS — M67432 Ganglion, left wrist: Secondary | ICD-10-CM | POA: Diagnosis not present

## 2022-09-23 DIAGNOSIS — G894 Chronic pain syndrome: Secondary | ICD-10-CM | POA: Diagnosis not present

## 2022-09-23 DIAGNOSIS — I10 Essential (primary) hypertension: Secondary | ICD-10-CM | POA: Diagnosis not present

## 2022-09-23 DIAGNOSIS — M4696 Unspecified inflammatory spondylopathy, lumbar region: Secondary | ICD-10-CM | POA: Diagnosis not present

## 2022-09-23 DIAGNOSIS — M47896 Other spondylosis, lumbar region: Secondary | ICD-10-CM | POA: Diagnosis not present

## 2022-09-23 DIAGNOSIS — R52 Pain, unspecified: Secondary | ICD-10-CM | POA: Diagnosis not present

## 2022-09-23 DIAGNOSIS — M19019 Primary osteoarthritis, unspecified shoulder: Secondary | ICD-10-CM | POA: Diagnosis not present

## 2022-09-23 DIAGNOSIS — Z79891 Long term (current) use of opiate analgesic: Secondary | ICD-10-CM | POA: Diagnosis not present

## 2022-09-23 DIAGNOSIS — M25512 Pain in left shoulder: Secondary | ICD-10-CM | POA: Diagnosis not present

## 2022-09-23 DIAGNOSIS — M25511 Pain in right shoulder: Secondary | ICD-10-CM | POA: Diagnosis not present

## 2022-09-29 DIAGNOSIS — E559 Vitamin D deficiency, unspecified: Secondary | ICD-10-CM | POA: Diagnosis not present

## 2022-09-29 DIAGNOSIS — E278 Other specified disorders of adrenal gland: Secondary | ICD-10-CM | POA: Diagnosis not present

## 2022-09-29 DIAGNOSIS — I83893 Varicose veins of bilateral lower extremities with other complications: Secondary | ICD-10-CM | POA: Diagnosis not present

## 2022-09-29 DIAGNOSIS — I700 Atherosclerosis of aorta: Secondary | ICD-10-CM | POA: Diagnosis not present

## 2022-09-29 DIAGNOSIS — I10 Essential (primary) hypertension: Secondary | ICD-10-CM | POA: Diagnosis not present

## 2022-09-29 DIAGNOSIS — E7800 Pure hypercholesterolemia, unspecified: Secondary | ICD-10-CM | POA: Diagnosis not present

## 2022-09-29 DIAGNOSIS — Z01818 Encounter for other preprocedural examination: Secondary | ICD-10-CM | POA: Diagnosis not present

## 2022-09-29 DIAGNOSIS — K219 Gastro-esophageal reflux disease without esophagitis: Secondary | ICD-10-CM | POA: Diagnosis not present

## 2022-10-04 ENCOUNTER — Encounter
Admission: RE | Admit: 2022-10-04 | Discharge: 2022-10-04 | Disposition: A | Discharge status: Home / Self Care | Payer: PPO | Source: Ambulatory Visit | Attending: Specialist | Admitting: Specialist

## 2022-10-04 HISTORY — DX: Essential (primary) hypertension: I10

## 2022-10-04 HISTORY — DX: Other injury of unspecified body region, initial encounter: T14.8XXA

## 2022-10-04 NOTE — Patient Instructions (Addendum)
Your procedure is scheduled on:10-14-22 Thursday Report to the Registration Desk on the 1st floor of the [facility].Then proceed to the 2nd floor Surgery Desk To find out your arrival time, please call [phone] between 1PM - 3PM on:10-13-22 Wednesday If your arrival time is 6:00 am, do not arrive before that time as the [facility] entrance doors do not open until 6:00 am.  REMEMBER: Instructions that are not followed completely may result in serious medical risk, up to and including death; or upon the discretion of your surgeon and anesthesiologist your surgery may need to be rescheduled.  Do not eat food after midnight the night before surgery.  No gum chewing or hard candies.  You may however, drink CLEAR liquids up to 2 hours before you are scheduled to arrive for your surgery. Do not drink anything within 2 hours of your scheduled arrival time.  Clear liquids include: - water  - apple juice without pulp - gatorade (not RED colors) - black coffee or tea (Do NOT add milk or creamers to the coffee or tea) Do NOT drink anything that is not on this list.  One week prior to surgery:Last dose will be on 10-06-22 Stop Anti-inflammatories (NSAIDS) such as Advil, Aleve, Ibuprofen, Motrin, Naproxen, Naprosyn and Aspirin based products such as Excedrin, Goody's Powder, BC Powder.You may however, continue to take Tylenol/Hydrocodone if needed for pain up until the day of surgery.  Stop ANY OVER THE COUNTER supplements/vitamins 7 days prior to surgery (Calcium + D, Vitamin D, COQ-10, Multivitamin, Vitamin B6, B12, C and E)  Call Dr Garen Lah office today (10-04-22) to see if you need to stop your 81 mg Aspirin  TAKE ONLY THESE MEDICATIONS THE MORNING OF SURGERY WITH A SIP OF WATER: -HYDROcodone-acetaminophen (NORCO/VICODIN)  -omeprazole (PRILOSEC) -take one the night before your surgery and one the morning of your surgery  Continue your losartan (COZAAR) up until the day prior to surgery-Do  NOT take the morning of surgery  No Alcohol for 24 hours before or after surgery.  No Smoking including e-cigarettes for 24 hours before surgery.  No chewable tobacco products for at least 6 hours before surgery.  No nicotine patches on the day of surgery.  Do not use any "recreational" drugs for at least a week (preferably 2 weeks) before your surgery.  Please be advised that the combination of cocaine and anesthesia may have negative outcomes, up to and including death. If you test positive for cocaine, your surgery will be cancelled.  On the morning of surgery brush your teeth with toothpaste and water, you may rinse your mouth with mouthwash if you wish. Do not swallow any toothpaste or mouthwash.  Use CHG Soap as directed on instruction sheet.  Do not wear jewelry, make-up, hairpins, clips or nail polish.  Do not wear lotions, powders, or perfumes.   Do not shave body hair from the neck down 48 hours before surgery.  Contact lenses, hearing aids and dentures may not be worn into surgery.  Do not bring valuables to the hospital. [facility] is not responsible for any missing/lost belongings or valuables. t.   Notify your doctor if there is any change in your medical condition (cold, fever, infection).  Wear comfortable clothing (specific to your surgery type) to the hospital.  After surgery, you can help prevent lung complications by doing breathing exercises.  Take deep breaths and cough every 1-2 hours. Your doctor may order a device called an Incentive Spirometer to help you take  deep breaths. When coughing or sneezing, hold a pillow firmly against your incision with both hands. This is called "splinting." Doing this helps protect your incision. It also decreases belly discomfort.  If you are being admitted to the hospital overnight, leave your suitcase in the car. After surgery it may be brought to your room.  In case of increased patient census, it may be necessary for  you, the patient, to continue your postoperative care in the Same Day Surgery department.  If you are being discharged the day of surgery, you will not be allowed to drive home. You will need a responsible individual to drive you home and stay with you for 24 hours after surgery.   If you are taking public transportation, you will need to have a responsible individual with you.  Please call the Pre-admissions Testing Dept. at [phone] if you have any questions about these instructions.  Surgery Visitation Policy:  Patients having surgery or a procedure may have two visitors.  Children under the age of 6 must have an adult with them who is not the patient.     Preparing for Surgery with CHLORHEXIDINE GLUCONATE (CHG) Soap  Chlorhexidine Gluconate (CHG) Soap  o An antiseptic cleaner that kills germs and bonds with the skin to continue killing germs even after washing  o Used for showering the night before surgery and morning of surgery  Before surgery, you can play an important role by reducing the number of germs on your skin.  CHG (Chlorhexidine gluconate) soap is an antiseptic cleanser which kills germs and bonds with the skin to continue killing germs even after washing.  Please do not use if you have an allergy to CHG or antibacterial soaps. If your skin becomes reddened/irritated stop using the CHG.  1. Shower the NIGHT BEFORE SURGERY and the MORNING OF SURGERY with CHG soap.  2. If you choose to wash your hair, wash your hair first as usual with your normal shampoo.  3. After shampooing, rinse your hair and body thoroughly to remove the shampoo.  4. Use CHG as you would any other liquid soap. You can apply CHG directly to the skin and wash gently with a scrungie or a clean washcloth.  5. Apply the CHG soap to your body only from the neck down. Do not use on open wounds or open sores. Avoid contact with your eyes, ears, mouth, and genitals (private parts). Wash face  and genitals (private parts) with your normal soap.  6. Wash thoroughly, paying special attention to the area where your surgery will be performed.  7. Thoroughly rinse your body with warm water.  8. Do not shower/wash with your normal soap after using and rinsing off the CHG soap.  9. Pat yourself dry with a clean towel.  10. Wear clean pajamas to bed the night before surgery.  12. Place clean sheets on your bed the night of your first shower and do not sleep with pets.  13. Shower again with the CHG soap on the day of surgery prior to arriving at the hospital.  14. Do not apply any deodorants/lotions/powders.  15. Please wear clean clothes to the hospital.

## 2022-10-29 ENCOUNTER — Other Ambulatory Visit: Payer: PPO

## 2022-11-02 ENCOUNTER — Other Ambulatory Visit: Payer: Self-pay | Admitting: Internal Medicine

## 2022-11-02 DIAGNOSIS — Z1231 Encounter for screening mammogram for malignant neoplasm of breast: Secondary | ICD-10-CM

## 2022-11-04 ENCOUNTER — Encounter: Admission: RE | Payer: Self-pay | Source: Home / Self Care

## 2022-11-04 ENCOUNTER — Ambulatory Visit: Admission: RE | Admit: 2022-11-04 | Payer: PPO | Source: Home / Self Care | Admitting: Specialist

## 2022-11-04 SURGERY — BILATERAL CARPAL TUNNEL RELEASE
Anesthesia: General | Site: Wrist | Laterality: Left

## 2022-11-05 DIAGNOSIS — M7522 Bicipital tendinitis, left shoulder: Secondary | ICD-10-CM | POA: Diagnosis not present

## 2022-12-15 ENCOUNTER — Ambulatory Visit
Admission: RE | Admit: 2022-12-15 | Discharge: 2022-12-15 | Disposition: A | Discharge status: Home / Self Care | Payer: PPO | Source: Ambulatory Visit | Attending: Internal Medicine | Admitting: Internal Medicine

## 2022-12-15 DIAGNOSIS — M1711 Unilateral primary osteoarthritis, right knee: Secondary | ICD-10-CM | POA: Diagnosis not present

## 2022-12-15 DIAGNOSIS — Z1231 Encounter for screening mammogram for malignant neoplasm of breast: Secondary | ICD-10-CM | POA: Diagnosis not present

## 2022-12-22 DIAGNOSIS — R52 Pain, unspecified: Secondary | ICD-10-CM | POA: Diagnosis not present

## 2022-12-22 DIAGNOSIS — M4696 Unspecified inflammatory spondylopathy, lumbar region: Secondary | ICD-10-CM | POA: Diagnosis not present

## 2022-12-22 DIAGNOSIS — Z79891 Long term (current) use of opiate analgesic: Secondary | ICD-10-CM | POA: Diagnosis not present

## 2022-12-22 DIAGNOSIS — M25512 Pain in left shoulder: Secondary | ICD-10-CM | POA: Diagnosis not present

## 2022-12-22 DIAGNOSIS — G894 Chronic pain syndrome: Secondary | ICD-10-CM | POA: Diagnosis not present

## 2022-12-22 DIAGNOSIS — M47896 Other spondylosis, lumbar region: Secondary | ICD-10-CM | POA: Diagnosis not present

## 2022-12-22 DIAGNOSIS — Z79899 Other long term (current) drug therapy: Secondary | ICD-10-CM | POA: Diagnosis not present

## 2022-12-22 DIAGNOSIS — M19019 Primary osteoarthritis, unspecified shoulder: Secondary | ICD-10-CM | POA: Diagnosis not present

## 2022-12-22 DIAGNOSIS — M25511 Pain in right shoulder: Secondary | ICD-10-CM | POA: Diagnosis not present

## 2023-01-27 DIAGNOSIS — H26492 Other secondary cataract, left eye: Secondary | ICD-10-CM | POA: Diagnosis not present

## 2023-01-27 DIAGNOSIS — H26491 Other secondary cataract, right eye: Secondary | ICD-10-CM | POA: Diagnosis not present

## 2023-01-27 DIAGNOSIS — Z961 Presence of intraocular lens: Secondary | ICD-10-CM | POA: Diagnosis not present

## 2023-01-27 DIAGNOSIS — H43813 Vitreous degeneration, bilateral: Secondary | ICD-10-CM | POA: Diagnosis not present

## 2023-02-18 DIAGNOSIS — M7522 Bicipital tendinitis, left shoulder: Secondary | ICD-10-CM | POA: Diagnosis not present

## 2023-02-24 DIAGNOSIS — H26491 Other secondary cataract, right eye: Secondary | ICD-10-CM | POA: Diagnosis not present

## 2023-03-21 DIAGNOSIS — H26492 Other secondary cataract, left eye: Secondary | ICD-10-CM | POA: Diagnosis not present

## 2023-03-24 DIAGNOSIS — R52 Pain, unspecified: Secondary | ICD-10-CM | POA: Diagnosis not present

## 2023-03-24 DIAGNOSIS — M19019 Primary osteoarthritis, unspecified shoulder: Secondary | ICD-10-CM | POA: Diagnosis not present

## 2023-03-24 DIAGNOSIS — M47896 Other spondylosis, lumbar region: Secondary | ICD-10-CM | POA: Diagnosis not present

## 2023-03-24 DIAGNOSIS — G894 Chronic pain syndrome: Secondary | ICD-10-CM | POA: Diagnosis not present

## 2023-03-24 DIAGNOSIS — M25511 Pain in right shoulder: Secondary | ICD-10-CM | POA: Diagnosis not present

## 2023-03-24 DIAGNOSIS — Z79891 Long term (current) use of opiate analgesic: Secondary | ICD-10-CM | POA: Diagnosis not present

## 2023-03-24 DIAGNOSIS — M25512 Pain in left shoulder: Secondary | ICD-10-CM | POA: Diagnosis not present

## 2023-03-24 DIAGNOSIS — M4696 Unspecified inflammatory spondylopathy, lumbar region: Secondary | ICD-10-CM | POA: Diagnosis not present

## 2023-03-25 DIAGNOSIS — R040 Epistaxis: Secondary | ICD-10-CM | POA: Diagnosis not present

## 2023-03-25 DIAGNOSIS — Z23 Encounter for immunization: Secondary | ICD-10-CM | POA: Diagnosis not present

## 2023-03-25 DIAGNOSIS — R197 Diarrhea, unspecified: Secondary | ICD-10-CM | POA: Diagnosis not present

## 2023-03-25 DIAGNOSIS — F320 Major depressive disorder, single episode, mild: Secondary | ICD-10-CM | POA: Diagnosis not present

## 2023-03-30 DIAGNOSIS — M1990 Unspecified osteoarthritis, unspecified site: Secondary | ICD-10-CM | POA: Diagnosis not present

## 2023-03-30 DIAGNOSIS — R42 Dizziness and giddiness: Secondary | ICD-10-CM | POA: Diagnosis not present

## 2023-03-30 DIAGNOSIS — Z87891 Personal history of nicotine dependence: Secondary | ICD-10-CM | POA: Diagnosis not present

## 2023-03-30 DIAGNOSIS — G8929 Other chronic pain: Secondary | ICD-10-CM | POA: Diagnosis not present

## 2023-03-30 DIAGNOSIS — G5603 Carpal tunnel syndrome, bilateral upper limbs: Secondary | ICD-10-CM | POA: Diagnosis not present

## 2023-03-30 DIAGNOSIS — I700 Atherosclerosis of aorta: Secondary | ICD-10-CM | POA: Diagnosis not present

## 2023-03-30 DIAGNOSIS — M810 Age-related osteoporosis without current pathological fracture: Secondary | ICD-10-CM | POA: Diagnosis not present

## 2023-03-30 DIAGNOSIS — I10 Essential (primary) hypertension: Secondary | ICD-10-CM | POA: Diagnosis not present

## 2023-03-30 DIAGNOSIS — E785 Hyperlipidemia, unspecified: Secondary | ICD-10-CM | POA: Diagnosis not present

## 2023-03-30 DIAGNOSIS — F330 Major depressive disorder, recurrent, mild: Secondary | ICD-10-CM | POA: Diagnosis not present

## 2023-03-30 DIAGNOSIS — J449 Chronic obstructive pulmonary disease, unspecified: Secondary | ICD-10-CM | POA: Diagnosis not present

## 2023-03-30 DIAGNOSIS — K219 Gastro-esophageal reflux disease without esophagitis: Secondary | ICD-10-CM | POA: Diagnosis not present

## 2023-04-04 DIAGNOSIS — R040 Epistaxis: Secondary | ICD-10-CM | POA: Diagnosis not present

## 2023-05-03 DIAGNOSIS — R040 Epistaxis: Secondary | ICD-10-CM | POA: Diagnosis not present

## 2023-05-17 DIAGNOSIS — E7800 Pure hypercholesterolemia, unspecified: Secondary | ICD-10-CM | POA: Diagnosis not present

## 2023-05-24 DIAGNOSIS — Z0000 Encounter for general adult medical examination without abnormal findings: Secondary | ICD-10-CM | POA: Diagnosis not present

## 2023-05-24 DIAGNOSIS — I10 Essential (primary) hypertension: Secondary | ICD-10-CM | POA: Diagnosis not present

## 2023-05-24 DIAGNOSIS — E559 Vitamin D deficiency, unspecified: Secondary | ICD-10-CM | POA: Diagnosis not present

## 2023-05-24 DIAGNOSIS — K219 Gastro-esophageal reflux disease without esophagitis: Secondary | ICD-10-CM | POA: Diagnosis not present

## 2023-05-24 DIAGNOSIS — E7800 Pure hypercholesterolemia, unspecified: Secondary | ICD-10-CM | POA: Diagnosis not present

## 2023-05-24 DIAGNOSIS — Z0001 Encounter for general adult medical examination with abnormal findings: Secondary | ICD-10-CM | POA: Diagnosis not present

## 2023-06-10 DIAGNOSIS — M25512 Pain in left shoulder: Secondary | ICD-10-CM | POA: Diagnosis not present

## 2023-07-06 DIAGNOSIS — M47896 Other spondylosis, lumbar region: Secondary | ICD-10-CM | POA: Diagnosis not present

## 2023-07-06 DIAGNOSIS — M4696 Unspecified inflammatory spondylopathy, lumbar region: Secondary | ICD-10-CM | POA: Diagnosis not present

## 2023-07-06 DIAGNOSIS — G894 Chronic pain syndrome: Secondary | ICD-10-CM | POA: Diagnosis not present

## 2023-07-06 DIAGNOSIS — Z79891 Long term (current) use of opiate analgesic: Secondary | ICD-10-CM | POA: Diagnosis not present

## 2023-07-06 DIAGNOSIS — M25511 Pain in right shoulder: Secondary | ICD-10-CM | POA: Diagnosis not present

## 2023-07-06 DIAGNOSIS — M25512 Pain in left shoulder: Secondary | ICD-10-CM | POA: Diagnosis not present

## 2023-09-02 DIAGNOSIS — M1711 Unilateral primary osteoarthritis, right knee: Secondary | ICD-10-CM | POA: Diagnosis not present

## 2023-09-16 DIAGNOSIS — M25512 Pain in left shoulder: Secondary | ICD-10-CM | POA: Diagnosis not present

## 2023-10-05 DIAGNOSIS — G5603 Carpal tunnel syndrome, bilateral upper limbs: Secondary | ICD-10-CM | POA: Diagnosis not present

## 2023-10-05 DIAGNOSIS — Z79891 Long term (current) use of opiate analgesic: Secondary | ICD-10-CM | POA: Diagnosis not present

## 2023-10-05 DIAGNOSIS — G894 Chronic pain syndrome: Secondary | ICD-10-CM | POA: Diagnosis not present

## 2023-10-05 DIAGNOSIS — M25511 Pain in right shoulder: Secondary | ICD-10-CM | POA: Diagnosis not present

## 2023-10-05 DIAGNOSIS — M25512 Pain in left shoulder: Secondary | ICD-10-CM | POA: Diagnosis not present

## 2023-10-05 DIAGNOSIS — M4696 Unspecified inflammatory spondylopathy, lumbar region: Secondary | ICD-10-CM | POA: Diagnosis not present

## 2023-10-05 DIAGNOSIS — M47896 Other spondylosis, lumbar region: Secondary | ICD-10-CM | POA: Diagnosis not present

## 2023-11-01 ENCOUNTER — Other Ambulatory Visit: Payer: Self-pay | Admitting: Internal Medicine

## 2023-11-01 DIAGNOSIS — Z1231 Encounter for screening mammogram for malignant neoplasm of breast: Secondary | ICD-10-CM

## 2023-11-15 DIAGNOSIS — I10 Essential (primary) hypertension: Secondary | ICD-10-CM | POA: Diagnosis not present

## 2023-11-22 DIAGNOSIS — Z0000 Encounter for general adult medical examination without abnormal findings: Secondary | ICD-10-CM | POA: Diagnosis not present

## 2023-11-22 DIAGNOSIS — I83893 Varicose veins of bilateral lower extremities with other complications: Secondary | ICD-10-CM | POA: Diagnosis not present

## 2023-11-22 DIAGNOSIS — Z1331 Encounter for screening for depression: Secondary | ICD-10-CM | POA: Diagnosis not present

## 2023-11-22 DIAGNOSIS — K219 Gastro-esophageal reflux disease without esophagitis: Secondary | ICD-10-CM | POA: Diagnosis not present

## 2023-11-22 DIAGNOSIS — E7800 Pure hypercholesterolemia, unspecified: Secondary | ICD-10-CM | POA: Diagnosis not present

## 2023-11-22 DIAGNOSIS — I10 Essential (primary) hypertension: Secondary | ICD-10-CM | POA: Diagnosis not present

## 2023-12-16 ENCOUNTER — Ambulatory Visit
Admission: RE | Admit: 2023-12-16 | Discharge: 2023-12-16 | Disposition: A | Discharge status: Home / Self Care | Source: Ambulatory Visit | Attending: Internal Medicine | Admitting: Internal Medicine

## 2023-12-16 DIAGNOSIS — Z1231 Encounter for screening mammogram for malignant neoplasm of breast: Secondary | ICD-10-CM | POA: Diagnosis not present

## 2023-12-19 DIAGNOSIS — H26492 Other secondary cataract, left eye: Secondary | ICD-10-CM | POA: Diagnosis not present

## 2023-12-19 DIAGNOSIS — H43813 Vitreous degeneration, bilateral: Secondary | ICD-10-CM | POA: Diagnosis not present

## 2023-12-19 DIAGNOSIS — Z961 Presence of intraocular lens: Secondary | ICD-10-CM | POA: Diagnosis not present

## 2023-12-19 DIAGNOSIS — H40003 Preglaucoma, unspecified, bilateral: Secondary | ICD-10-CM | POA: Diagnosis not present

## 2024-01-05 DIAGNOSIS — M25512 Pain in left shoulder: Secondary | ICD-10-CM | POA: Diagnosis not present

## 2024-01-05 DIAGNOSIS — M4696 Unspecified inflammatory spondylopathy, lumbar region: Secondary | ICD-10-CM | POA: Diagnosis not present

## 2024-01-05 DIAGNOSIS — G894 Chronic pain syndrome: Secondary | ICD-10-CM | POA: Diagnosis not present

## 2024-01-05 DIAGNOSIS — M25511 Pain in right shoulder: Secondary | ICD-10-CM | POA: Diagnosis not present

## 2024-01-05 DIAGNOSIS — G5603 Carpal tunnel syndrome, bilateral upper limbs: Secondary | ICD-10-CM | POA: Diagnosis not present

## 2024-01-05 DIAGNOSIS — Z79891 Long term (current) use of opiate analgesic: Secondary | ICD-10-CM | POA: Diagnosis not present

## 2024-01-05 DIAGNOSIS — M47896 Other spondylosis, lumbar region: Secondary | ICD-10-CM | POA: Diagnosis not present

## 2024-01-05 DIAGNOSIS — Z79899 Other long term (current) drug therapy: Secondary | ICD-10-CM | POA: Diagnosis not present

## 2024-02-22 DIAGNOSIS — M25512 Pain in left shoulder: Secondary | ICD-10-CM | POA: Diagnosis not present

## 2024-04-05 DIAGNOSIS — M25511 Pain in right shoulder: Secondary | ICD-10-CM | POA: Diagnosis not present

## 2024-04-05 DIAGNOSIS — G5603 Carpal tunnel syndrome, bilateral upper limbs: Secondary | ICD-10-CM | POA: Diagnosis not present

## 2024-04-05 DIAGNOSIS — G894 Chronic pain syndrome: Secondary | ICD-10-CM | POA: Diagnosis not present

## 2024-04-05 DIAGNOSIS — M47896 Other spondylosis, lumbar region: Secondary | ICD-10-CM | POA: Diagnosis not present

## 2024-04-05 DIAGNOSIS — M19012 Primary osteoarthritis, left shoulder: Secondary | ICD-10-CM | POA: Diagnosis not present

## 2024-04-05 DIAGNOSIS — Z79891 Long term (current) use of opiate analgesic: Secondary | ICD-10-CM | POA: Diagnosis not present

## 2024-04-05 DIAGNOSIS — M4696 Unspecified inflammatory spondylopathy, lumbar region: Secondary | ICD-10-CM | POA: Diagnosis not present

## 2024-04-19 DIAGNOSIS — Z96611 Presence of right artificial shoulder joint: Secondary | ICD-10-CM | POA: Diagnosis not present

## 2024-05-03 DIAGNOSIS — Z96611 Presence of right artificial shoulder joint: Secondary | ICD-10-CM | POA: Diagnosis not present

## 2024-05-31 DIAGNOSIS — M25512 Pain in left shoulder: Secondary | ICD-10-CM | POA: Diagnosis not present

## 2024-06-07 ENCOUNTER — Other Ambulatory Visit: Payer: Self-pay | Admitting: Physician Assistant

## 2024-06-07 DIAGNOSIS — Z96611 Presence of right artificial shoulder joint: Secondary | ICD-10-CM

## 2024-06-15 ENCOUNTER — Ambulatory Visit
Admission: RE | Admit: 2024-06-15 | Discharge: 2024-06-15 | Disposition: A | Discharge status: Home / Self Care | Source: Ambulatory Visit | Attending: Physician Assistant | Admitting: Physician Assistant

## 2024-06-15 DIAGNOSIS — Z96611 Presence of right artificial shoulder joint: Secondary | ICD-10-CM | POA: Diagnosis present
